# Patient Record
Sex: Male | Born: 1950 | Race: White | Hispanic: No | Marital: Single | Smoking: Former smoker
Health system: Southern US, Community
[De-identification: ages and names within clinical notes are randomized; demographics above are authoritative.]

## PROBLEM LIST (undated history)

## (undated) DIAGNOSIS — N200 Calculus of kidney: Secondary | ICD-10-CM

## (undated) HISTORY — DX: Calculus of kidney: N20.0

---

## 1992-06-15 HISTORY — PX: KIDNEY STONE SURGERY: SHX686

## 2002-09-21 ENCOUNTER — Ambulatory Visit (HOSPITAL_COMMUNITY): Admission: RE | Admit: 2002-09-21 | Discharge: 2002-09-21 | Payer: Self-pay | Admitting: Internal Medicine

## 2002-09-21 ENCOUNTER — Encounter: Payer: Self-pay | Admitting: Internal Medicine

## 2005-01-09 ENCOUNTER — Ambulatory Visit: Payer: Self-pay | Admitting: Internal Medicine

## 2005-01-12 ENCOUNTER — Ambulatory Visit: Payer: Self-pay | Admitting: *Deleted

## 2010-04-02 ENCOUNTER — Encounter (INDEPENDENT_AMBULATORY_CARE_PROVIDER_SITE_OTHER): Payer: Self-pay | Admitting: *Deleted

## 2010-05-20 ENCOUNTER — Ambulatory Visit: Payer: Self-pay | Admitting: Gastroenterology

## 2010-05-20 DIAGNOSIS — K219 Gastro-esophageal reflux disease without esophagitis: Secondary | ICD-10-CM | POA: Insufficient documentation

## 2010-06-23 ENCOUNTER — Telehealth: Payer: Self-pay | Admitting: Gastroenterology

## 2010-07-09 ENCOUNTER — Telehealth: Payer: Self-pay | Admitting: Gastroenterology

## 2010-07-09 ENCOUNTER — Ambulatory Visit: Admit: 2010-07-09 | Payer: Self-pay | Admitting: Gastroenterology

## 2010-07-17 NOTE — Letter (Signed)
Summary: New Patient letter  [facility]  [address] [place], [place] [address]   Phone: [phone]  Fax: [phone]       04/02/2010 MRN: [id number]  Wayne Mccarty [address] [place], [place]  [address]  Dear Wayne Mccarty,  Welcome to the Gastroenterology Division at [facility].    You are scheduled to see Dr. Arlyce Dice on 05/15/2010 at 8:30AM on the 3rd floor at [facility], [address]  We ask that you try to arrive at our office 15 minutes prior to your appointment time to allow for check-in.  We would like you to complete the enclosed self-administered evaluation form prior to your visit and bring it with you on the day of your appointment.  We will review it with you.  Also, please bring a complete list of all your medications or, if you prefer, bring the medication bottles and we will list them.  Please bring your insurance card so that we may make a copy of it.  If your insurance requires a referral to see a specialist, please bring your referral form from your primary care physician.  Co-payments are due at the time of your visit and may be paid by cash, check or credit card.     Your office visit will consist of a consult with your physician (includes a physical exam), any laboratory testing he/she may order, scheduling of any necessary diagnostic testing (e.g. x-ray, ultrasound, CT-scan), and scheduling of a procedure (e.g. Endoscopy, Colonoscopy) if required.  Please allow enough time on your schedule to allow for any/all of these possibilities.    If you cannot keep your appointment, please call [phone] to cancel or reschedule prior to your appointment date.  This allows [place] the opportunity to schedule an appointment for another patient in need of care.  If you do not cancel or reschedule by 5 p.m. the business day prior to your appointment date, you will be charged a $50.00 late cancellation/no-show fee.    Thank you for choosing  [facility] for your medical needs.  We appreciate the opportunity to care for you.  Please visit [place] at our website  to learn more about our practice.                     Sincerely,                                                             The Gastroenterology Division

## 2010-07-17 NOTE — Letter (Signed)
Summary: EGD Instructions  [facility]  [address] [place], [place] [address]   Phone: [phone]  Fax: [phone]       [name]    Jun 16, 1950    MRN: [id number]       Procedure Day /Date:TUESDAY 06/24/2010     Arrival Time: 2:30PM     Procedure Time:3:30PM     Location of Procedure:                    X  [facility] (4th Floor)    PREPARATION FOR ENDOSCOPY   On 06/24/2010  THE DAY OF THE PROCEDURE:  1.   No solid foods, milk or milk products are allowed after midnight the night before your procedure.  2.   Do not drink anything colored red or purple.  Avoid juices with pulp.  No orange juice.  3.  You may drink clear liquids until1:30PM , which is 2 hours before your procedure.                                                                                                CLEAR LIQUIDS INCLUDE: Water Jello Ice Popsicles Tea (sugar ok, no milk/cream) Powdered fruit flavored drinks Coffee (sugar ok, no milk/cream) Gatorade Juice: apple, white grape, white cranberry  Lemonade Clear bullion, consomm, broth Carbonated beverages (any kind) Strained chicken noodle soup Hard Candy   MEDICATION INSTRUCTIONS  Unless otherwise instructed, you should take regular prescription medications with a small sip of water as early as possible the morning of your procedure.            OTHER INSTRUCTIONS  You will need a responsible adult at least 62 years of age to accompany you and drive you home.   This person must remain in the waiting room during your procedure.  Wear loose fitting clothing that is easily removed.  Leave jewelry and other valuables at home.  However, you may wish to bring a book to read or an iPod/MP3 player to listen to music as you wait for your procedure to start.  Remove all body piercing jewelry and leave at home.  Total time from sign-in until discharge is approximately 2-3 hours.  You should go home  directly after your procedure and rest.  You can resume normal activities the day after your procedure.  The day of your procedure you should not:   Drive   Make legal decisions   Operate machinery   Drink alcohol   Return to work  You will receive specific instructions about eating, activities and medications before you leave.    The above instructions have been reviewed and explained to me by   _______________________    I fully understand and can verbalize these instructions _____________________________ Date _________

## 2010-07-17 NOTE — Letter (Signed)
Summary: Results Letter  [facility]  [address] [place], [place] [address]   Phone: [phone]  Fax: [phone]        May 20, 2010 MRN: [id number]    Wayne Mccarty [address] [place], [place]  [address]    Dear [name],  It is my pleasure to have treated you recently as a new patient in my office. I appreciate your confidence and the opportunity to participate in your care.  Since I do have a busy inpatient endoscopy schedule and office schedule, my office hours vary weekly. I am, however, available for emergency calls everyday through my office. If I am not available for an urgent office appointment, another one of our gastroenterologist will be able to assist you.  My well-trained staff are prepared to help you at all times. For emergencies after office hours, a physician from our Gastroenterology section is always available through my 24 hour answering service  Once again I welcome you as a new patient and I look forward to a happy and healthy relationship             Sincerely,  Louis Meckel MD  This letter has been electronically signed by your physician.  Appended Document: Results Letter LETTER MAILED

## 2010-07-17 NOTE — Assessment & Plan Note (Signed)
Summary: REFLUX, ESOPHAGITIS...AS.   History of Present Illness Visit Type: consult Primary GI MD: Melvia Heaps MD [facility] Primary Provider: Orpah Cobb, MD Requesting Provider: Orpah Cobb, MD Chief Complaint: pt states he is having reflux that will wake him up and he will have to take Pepcid AC.  Pt states ice cream or milkshakes will help his stomach feel better. History of Present Illness:   Wayne Mccarty is a 60 year old white male referred at the request of Dr. Algie Coffer for evaluation of reflux.  For at least 10 years she has been having severe burning chest discomfort, especially in the early a.m. upon awakening.  He has used various PPIs and H2 receptor antagonists.  The PPIs have caused a variety of side effects including headaches and diarrhea.  The H2 receptor antagonists have had variable success.  He denies dysphagia or cough.  Family history is pertinent for a sister who had colon cancer at age 22.   GI Review of Systems    Reports abdominal pain, acid reflux, bloating, and  heartburn.      Denies belching, chest pain, dysphagia with liquids, dysphagia with solids, loss of appetite, nausea, vomiting, vomiting blood, weight loss, and  weight gain.      Reports constipation, heme positive stool, and  hemorrhoids.     Denies anal fissure, black tarry stools, change in bowel habit, diarrhea, diverticulosis, fecal incontinence, irritable bowel syndrome, jaundice, light color stool, liver problems, rectal bleeding, and  rectal pain. Preventive Screening-Counseling & Management  Alcohol-Tobacco     Smoking Status: quit      Drug Use:  no.      Current Medications (verified): 1)  Pepcid Ac 10 Mg Tabs (Famotidine) .... 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  Alcohol Use - no Daily Caffeine Use 4/day Illicit Drug Use - no Smoking Status:  quit Drug Use:  no  Review of Systems       The patient complains of arthritis/joint pain, back pain, blood in urine, depression-new, fatigue, headaches-new, muscle pains/cramps, skin rash, sleeping problems, sore throat, thirst - excessive, and urination - excessive.  The patient denies allergy/sinus, anemia, anxiety-new, breast changes/lumps, confusion, cough, coughing up blood, fainting, fever, hearing problems, heart murmur, heart rhythm changes, itching, night sweats, nosebleeds, shortness of breath, swelling of feet/legs, swollen lymph glands, urination changes/pain, urine leakage, vision changes, and voice change.         All other systems were reviewed and were negative   Vital Signs:  Patient profile:   60 year old male Height:      72 inches Weight:      129 pounds BMI:     17.56 Pulse rate:   76 / minute Pulse rhythm:   regular BP sitting:   100 / 62  (left arm) Cuff size:   regular  Vitals Entered By: Francee Piccolo CMA Duncan Dull) (May 20, 2010 3:51 PM)  Physical Exam  Additional Exam:  On physical exam he is a thin male  skin: anicteric HEENT: normocephalic; PEERLA;  no nasal or pharyngeal abnormalities neck: supple nodes: no cervical lymphadenopathy chest: clear to ausculatation and percussion heart: no murmurs, gallops, or rubs abd: soft, nontender; BS normoactive; no abdominal masses, tenderness, organomegaly rectal: deferred ext: no cynanosis, clubbing, edema skeletal: no deformities neuro: oriented x 3; no focal abnormalities    Impression &  Recommendations:  Problem # 1:  GERD (ICD-530.81)  Patient remains symptomatic on his current regimen of Pepcid 20 mg daily.  Recommendations #1 increase Prevacid to b.i.d. with his last dose taking q.h.s.  I will try this prior to any in PPIs since he has been intolerant of PPIs due to side effects.   #2 endoscopy to rule out Barrett's esophagus  Risks, alternatives, and complications of the procedure, including bleeding, perforation, and possible need for surgery, were explained to the patient.  Patient's questions were answered.  Orders: EGD (EGD)  Problem # 2:  CARCINOMA, COLON, FAMILY HX (ICD-V16.0) Plan screening colonoscopy  Patient Instructions: 1)  Copy sent to : Orpah Cobb, MD 2)  We will see you at your procedure on 06/24/10. 3)  Please call our office after your EGD to schedule your colonoscopy. 4)  [facility] Patient Information Guide given to patient.  5)  Upper Endoscopy brochure given.  6)  The medication list was reviewed and reconciled.  All changed / newly prescribed medications were explained.  A complete medication list was provided to the patient / caregiver. Prescriptions: PEPCID AC MAXIMUM STRENGTH 20 MG CHEW (FAMOTIDINE) chew one by mouth two times a day  #60 x 3   Entered by:   Merri Ray CMA (AAMA)   Authorized by:   Louis Meckel MD   Signed by:   Louis Meckel MD on 05/21/2010   Method used:   Electronically to        [facility]  [address] (retail)       [address]       [place], [place]  [address]       Ph: [phone] or [phone]       Fax: [phone]   RxID:   [id number]

## 2010-07-23 NOTE — Progress Notes (Signed)
Summary: cx proc   Phone Note Call from Patient Call back at Home Phone [phone]   Caller: Patient Call For: Dr Arlyce Dice Summary of Call: Patient cancelled his EGD for tomorrow because his driver cancelled on him, patient rescheduled for next available 1-25.  Will you charge? Initial call taken by: Tawni Levy,  June 23, 2010 4:31 PM  Follow-up for Phone Call        Too late to fill the spot.  Yes, he should be charged Follow-up by: Louis Meckel MD,  June 24, 2010 10:09 AM  Additional Follow-up for Phone Call Additional follow up Details #1::        Patient BILLED Colon Cx fee $100. Additional Follow-up by: Leanor Kail [place],  July 15, 2010 4:30 PM

## 2010-07-23 NOTE — Progress Notes (Signed)
Summary: Canceled Endo  Phone Note Call from Patient Call back at Home Phone [phone]   Caller: Patient Call For: Dr. Arlyce Dice Reason for Call: Talk to Nurse Summary of Call: pt. canceled his EGD today because he could not find a driver. Would you like pt. charged the cancelation fee? Initial call taken by: Karna Christmas,  July 09, 2010 3:17 PM  Follow-up for Phone Call        yes Follow-up by: Louis Meckel MD,  July 09, 2010 3:20 PM  Additional Follow-up for Phone Call Additional follow up Details #1::        Patient BILLED $100 SAME DAY CX FEE. Additional Follow-up by: Leanor Kail [facility],  July 15, 2010 5:57 PM

## 2011-03-16 ENCOUNTER — Other Ambulatory Visit: Payer: Self-pay | Admitting: Gastroenterology

## 2011-05-20 ENCOUNTER — Telehealth: Payer: Self-pay | Admitting: Gastroenterology

## 2011-05-20 NOTE — Telephone Encounter (Signed)
Pt states that the Famotadine is not working as well as it was, pt states that he tried Nexium and it did not work for him either. Pt would like something else to take for GERD. Please advise.

## 2011-05-21 MED ORDER — OMEPRAZOLE-SODIUM BICARBONATE 40-1100 MG PO CAPS: 1.0000 | ORAL_CAPSULE | Freq: Every day | ORAL | Status: AC

## 2011-05-21 NOTE — Telephone Encounter (Signed)
Pt states that his PCP gave him samples of Dexilant and it "didn't work to good." Pt would like to try Zegerid, states some friends of his have tried it and it worked well for them. Let pt know he needed the egd and colon. Pt states he will call back to schedule the procedures. Please advise.

## 2011-05-21 NOTE — Telephone Encounter (Signed)
At his last office visit endoscopy was recommended. If this was not done he should have it done.  Let's try dexilant 60 mg once a day  10 she had a colonoscopy in the last 5 years, if not he needs one because of a family history

## 2011-05-21 NOTE — Telephone Encounter (Signed)
Okay for  Zegerid

## 2011-05-21 NOTE — Telephone Encounter (Signed)
Rx sent to pharmacy   

## 2011-05-26 ENCOUNTER — Telehealth: Payer: Self-pay | Admitting: *Deleted

## 2011-05-26 NOTE — Telephone Encounter (Signed)
L/M for patient that insurance will not cover Zegerid unless he has been on Protonix,Prevacid OTC or Omeprazole.Marland KitchenMarland KitchenMarland Kitchen

## 2011-05-27 ENCOUNTER — Telehealth: Payer: Self-pay | Admitting: *Deleted

## 2011-05-27 NOTE — Telephone Encounter (Signed)
Tried to contact pt to inform him his medication was approved for Zegerid. No Answer No Answering Machine

## 2011-05-28 NOTE — Telephone Encounter (Signed)
Approved for 1 year.

## 2011-07-22 ENCOUNTER — Telehealth: Payer: Self-pay | Admitting: Gastroenterology

## 2011-07-23 NOTE — Telephone Encounter (Signed)
Left message for pt to call back  °

## 2011-07-27 NOTE — Telephone Encounter (Signed)
Left message for pt to call back  °

## 2011-07-30 NOTE — Telephone Encounter (Signed)
Pt wanted to let us know he had a CT scan done by urology and thought it might be something we might want to look at. Thanked pt and let him know to call us back if we could help with anything further.

## 2012-03-07 ENCOUNTER — Other Ambulatory Visit: Payer: Self-pay | Admitting: Gastroenterology

## 2012-03-07 DIAGNOSIS — R1013 Epigastric pain: Secondary | ICD-10-CM

## 2012-03-15 ENCOUNTER — Other Ambulatory Visit: Payer: Self-pay

## 2012-03-31 ENCOUNTER — Other Ambulatory Visit: Payer: Self-pay

## 2017-07-31 ENCOUNTER — Other Ambulatory Visit: Payer: Self-pay

## 2017-07-31 ENCOUNTER — Emergency Department
Admission: EM | Admit: 2017-07-31 | Discharge: 2017-08-01 | Disposition: A | Discharge status: Home / Self Care | Payer: Medicare Other | Attending: Emergency Medicine | Admitting: Emergency Medicine

## 2017-07-31 ENCOUNTER — Encounter: Payer: Self-pay | Admitting: Emergency Medicine

## 2017-07-31 DIAGNOSIS — N200 Calculus of kidney: Secondary | ICD-10-CM | POA: Insufficient documentation

## 2017-07-31 DIAGNOSIS — R319 Hematuria, unspecified: Secondary | ICD-10-CM | POA: Diagnosis not present

## 2017-07-31 DIAGNOSIS — Z87891 Personal history of nicotine dependence: Secondary | ICD-10-CM | POA: Insufficient documentation

## 2017-07-31 DIAGNOSIS — Z79899 Other long term (current) drug therapy: Secondary | ICD-10-CM | POA: Diagnosis not present

## 2017-07-31 LAB — CBC
HEMATOCRIT: 39.4 % — AB (ref 40.0–52.0)
Hemoglobin: 13.7 g/dL (ref 13.0–18.0)
MCH: 30 pg (ref 26.0–34.0)
MCHC: 34.7 g/dL (ref 32.0–36.0)
MCV: 86.4 fL (ref 80.0–100.0)
PLATELETS: 172 10*3/uL (ref 150–440)
RBC: 4.56 MIL/uL (ref 4.40–5.90)
RDW: 14.1 % (ref 11.5–14.5)
WBC: 3.6 10*3/uL — ABNORMAL LOW (ref 3.8–10.6)

## 2017-07-31 LAB — URINALYSIS, COMPLETE (UACMP) WITH MICROSCOPIC
Bacteria, UA: NONE SEEN
Bilirubin Urine: NEGATIVE
GLUCOSE, UA: NEGATIVE mg/dL
Ketones, ur: NEGATIVE mg/dL
NITRITE: NEGATIVE
PH: 5 (ref 5.0–8.0)
Protein, ur: 100 mg/dL — AB
SPECIFIC GRAVITY, URINE: 1.019 (ref 1.005–1.030)

## 2017-07-31 LAB — BASIC METABOLIC PANEL
Anion gap: 7 (ref 5–15)
BUN: 24 mg/dL — AB (ref 6–20)
CALCIUM: 8.5 mg/dL — AB (ref 8.9–10.3)
CO2: 29 mmol/L (ref 22–32)
Chloride: 103 mmol/L (ref 101–111)
Creatinine, Ser: 0.94 mg/dL (ref 0.61–1.24)
GFR calc Af Amer: >60 mL/min (ref >60)
GLUCOSE: 85 mg/dL (ref 65–99)
Potassium: 3.7 mmol/L (ref 3.5–5.1)
Sodium: 139 mmol/L (ref 135–145)

## 2017-07-31 NOTE — ED Triage Notes (Signed)
Pt ambulatory to triage in NAD, reports blood in urine, reports known kidney stone, reports intermittent pain.

## 2017-08-01 ENCOUNTER — Emergency Department: Payer: Medicare Other

## 2017-08-01 DIAGNOSIS — R319 Hematuria, unspecified: Secondary | ICD-10-CM | POA: Diagnosis not present

## 2017-08-01 MED ORDER — ONDANSETRON 4 MG PO TBDP: 4.0000 mg | ORAL_TABLET | Freq: Three times a day (TID) | ORAL | 0 refills | Status: DC | PRN

## 2017-08-01 MED ORDER — TRAMADOL HCL 50 MG PO TABS: 50.0000 mg | ORAL_TABLET | Freq: Four times a day (QID) | ORAL | 0 refills | Status: DC | PRN

## 2017-08-01 NOTE — ED Notes (Signed)
Pt reports blood in his urine today. Reports hx of kidney stone years ago. Reports intermittent discomfort to his right lower flank region but no pain at this time.

## 2017-08-01 NOTE — ED Provider Notes (Signed)
[facility] Emergency Department Provider Note   ____________________________________________   First MD Initiated Contact with Patient 07/31/17 2359     (approximate)  I have reviewed the triage vital signs and the nursing notes.   HISTORY  Chief Complaint Hematuria    HPI Wayne Mccarty is a 67 y.o. male who comes into the hospital today stating that he has been passing some blood in his urine.  The patient states that it started a couple of weeks ago.  He reports that he has had some pains in his back and he could not sleep.  He took some oxycodone's to help him sleep when he had this pain.  He reports then that he became sick with a respiratory illness.  He had some joint pains with some chills and scratchy throat.  He reports that he is unsure if it is due to his kidneys or not.  He has a history of a stone he reports is 30 mm in his kidney from a couple of years ago.  He states that he has not checked it in a while so he is concerned that it is much larger.  He has some occasional pains on his right side but came in because he was concerned about the blood in his urine.  The patient also has some pressure in his bladder when he coughs.  He is here today for evaluation.  History reviewed. No pertinent past medical history.  Patient Active Problem List   Diagnosis Date Noted  . GERD 05/20/2010    History reviewed. No pertinent surgical history.  Prior to Admission medications   Medication Sig Start Date End Date Taking? Authorizing Provider  famotidine (PEPCID) 20 MG tablet TAKE 1 TABLET BY MOUTH 2 TIMES A DAY 03/16/11   Inda Castle, MD  ondansetron (ZOFRAN ODT) 4 MG disintegrating tablet Take 1 tablet (4 mg total) by mouth every 8 (eight) hours as needed for nausea or vomiting. 08/01/17   Loney Hering, MD  traMADol (ULTRAM) 50 MG tablet Take 1 tablet (50 mg total) by mouth every 6 (six) hours as needed. 08/01/17   Loney Hering, MD     Allergies Sulfa antibiotics  History reviewed. No pertinent family history.  Social History Social History   Tobacco Use  . Smoking status: Former Research scientist (life sciences)  . Smokeless tobacco: Never Used  Substance Use Topics  . Alcohol use: Yes  . Drug use: Not on file    Review of Systems  Constitutional: No fever/chills Eyes: No visual changes. ENT: No sore throat. Cardiovascular: Denies chest pain. Respiratory: Denies shortness of breath. Gastrointestinal: No abdominal pain.  No nausea, no vomiting.  No diarrhea.  No constipation. Genitourinary: hematuria Musculoskeletal:  back pain. Skin: Negative for rash. Neurological: Negative for headaches, focal weakness or numbness.   ____________________________________________   PHYSICAL EXAM:  VITAL SIGNS: ED Triage Vitals  Enc Vitals Group     BP 07/31/17 2041 116/70     Pulse Rate 07/31/17 2041 76     Resp 07/31/17 2041 16     Temp 07/31/17 2041 97.7 F (36.5 C)     Temp Source 07/31/17 2041 Oral     SpO2 07/31/17 2041 100 %     Weight 07/31/17 2042 150 lb (68 kg)     Height 07/31/17 2042 5\' 11"  (1.803 m)     Head Circumference --      Peak Flow --      Pain Score --  Pain Loc --      Pain Edu? --      Excl. in [place]? --     Constitutional: Alert and oriented. Well appearing and in no acute distress. Eyes: Conjunctivae are normal. PERRL. EOMI. Head: Atraumatic. Nose: No congestion/rhinnorhea. Mouth/Throat: Mucous membranes are moist.  Oropharynx non-erythematous. Cardiovascular: Normal rate, regular rhythm. Grossly normal heart sounds.  Good peripheral circulation. Respiratory: Normal respiratory effort.  No retractions. Lungs CTAB. Gastrointestinal: Soft with mild RLQ abd pain. No distention. Positive bowel sounds Musculoskeletal: No lower extremity tenderness nor edema.  Neurologic:  Normal speech and language.  Skin:  Skin is warm, dry and intact.  Psychiatric: Mood and affect are normal.    ____________________________________________   LABS (all labs ordered are listed, but only abnormal results are displayed)  Labs Reviewed  URINALYSIS, COMPLETE (UACMP) WITH MICROSCOPIC - Abnormal; Notable for the following components:      Result Value   Color, Urine AMBER (*)    APPearance CLOUDY (*)    Hgb urine dipstick LARGE (*)    Protein, ur 100 (*)    Leukocytes, UA MODERATE (*)    Squamous Epithelial / LPF 0-5 (*)    All other components within normal limits  CBC - Abnormal; Notable for the following components:   WBC 3.6 (*)    HCT 39.4 (*)    All other components within normal limits  BASIC METABOLIC PANEL - Abnormal; Notable for the following components:   BUN 24 (*)    Calcium 8.5 (*)    All other components within normal limits   ____________________________________________  EKG  none ____________________________________________  RADIOLOGY  ED MD interpretation:  CT renal stone study: large right staghorn calculus  Official radiology report(s): Ct Renal Stone Study  Result Date: 08/01/2017 CLINICAL DATA:  Hematuria and right lower quadrant pain EXAM: CT ABDOMEN AND PELVIS WITHOUT CONTRAST TECHNIQUE: Multidetector CT imaging of the abdomen and pelvis was performed following the standard protocol without IV contrast. COMPARISON:  CT abdomen pelvis 05/02/2010 FINDINGS: Lower chest: No basilar pulmonary nodules or pleural effusion. No apical pericardial effusion. Hepatobiliary: Normal hepatic contours and density. No visible biliary dilatation. Normal gallbladder. Pancreas: Normal parenchymal contours without ductal dilatation. No peripancreatic fluid collection. Spleen: Normal. Adrenals/Urinary Tract: --Adrenal glands: Normal. 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 There are multiple other large right renal stones. No right ureteral obstruction.  --Left kidney/ureter: No hydronephrosis, perinephric stranding or nephrolithiasis. No obstructing ureteral stones. --Urinary bladder: Bladder wall is thickened, but unchanged. Stomach/Bowel: --Stomach/Duodenum: No hiatal hernia or other gastric abnormality. Normal duodenal course. --Small bowel: No dilatation or inflammation. --Colon: No focal abnormality. --Appendix: Normal. Vascular/Lymphatic: Atherosclerotic calcification is present within the non-aneurysmal abdominal aorta, without hemodynamically significant stenosis. No abdominal or pelvic lymphadenopathy. Reproductive: Prostate calcification. Musculoskeletal. No bony spinal canal stenosis or focal osseous abnormality. Other: Small right inguinal hernia containing a short segment of bowel. IMPRESSION: 1. 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 Multiple other nonobstructive right renal calculi. 2. No left nephrolithiasis. 3. No obstructing ureteral stones. 4. Unchanged urinary bladder wall thickening. Electronically Signed   By: Ulyses Jarred M.D.   On: 08/01/2017 01:06    ____________________________________________   PROCEDURES  Procedure(s) performed: None  Procedures  Critical Care performed: No  ____________________________________________   INITIAL IMPRESSION / ASSESSMENT AND PLAN / ED COURSE  As part of my medical decision making, I reviewed the following data within the electronic MEDICAL RECORD NUMBER Notes from prior ED visits and [place] Controlled Substance Database   This is a 67 year old who comes into the hospital today with some hematuria.  My differential diagnosis includes kidney stone, urinary tract infection, bladder mass.  I did check some blood work on the patient which was unremarkable.  His urine showed too numerous to count red blood cells and white blood cells but he does not have any bacteria in his urine.  He also has no nitrates.  I sent the patient for a CT  renal stone study and he was found to have a large right staghorn calculus that has increased in size from November 2011. The patient though has some chronic unchanged bladder wall thickening which is likely from the same time.  The patient is not having any significant pain at this time.  I will referred the patient to urology to follow-up to have his calculus removed given his hematuria.  The patient will be discharged to follow-up.      ____________________________________________   FINAL CLINICAL IMPRESSION(S) / ED DIAGNOSES  Final diagnoses:  Hematuria, unspecified type  Staghorn calculus     ED Discharge Orders        Ordered    traMADol (ULTRAM) 50 MG tablet  Every 6 hours PRN     08/01/17 0159    ondansetron (ZOFRAN ODT) 4 MG disintegrating tablet  Every 8 hours PRN     08/01/17 0159       Note:  This document was prepared using Dragon voice recognition software and may include unintentional dictation errors.    Loney Hering, MD 08/01/17 0200

## 2017-08-01 NOTE — Discharge Instructions (Signed)
Please follow up with urology to have this kidney stone removed. Please return with any worsened condition

## 2017-10-15 ENCOUNTER — Encounter: Payer: Self-pay | Admitting: Urology

## 2017-10-15 ENCOUNTER — Ambulatory Visit (INDEPENDENT_AMBULATORY_CARE_PROVIDER_SITE_OTHER): Payer: Medicare Other | Admitting: Urology

## 2017-10-15 VITALS — BP 90/58 | HR 79 | Ht 66.0 in | Wt 125.6 lb

## 2017-10-15 DIAGNOSIS — N200 Calculus of kidney: Secondary | ICD-10-CM | POA: Diagnosis not present

## 2017-10-15 LAB — URINALYSIS, COMPLETE
Bilirubin, UA: NEGATIVE
Glucose, UA: NEGATIVE
Ketones, UA: NEGATIVE
Nitrite, UA: NEGATIVE
Specific Gravity, UA: >1.03 — ABNORMAL HIGH (ref 1.005–1.030)
Urobilinogen, Ur: 0.2 mg/dL (ref 0.2–1.0)
pH, UA: 5 (ref 5.0–7.5)

## 2017-10-15 LAB — MICROSCOPIC EXAMINATION: Epithelial Cells (non renal): NONE SEEN /hpf (ref 0–10)

## 2017-10-15 NOTE — Progress Notes (Signed)
10/15/2017 2:44 PM   Wayne Mccarty 1951-01-16 [id number]  Referring provider: Dixie Dials, MD [address] [place], [place] [address]  Chief Complaint  Patient presents with  . New Patient (Initial Visit)  . 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  It has increased in size since 2011.  The patient at this point endorses right lower back pain.  He also complains of an electrical shock feeling in his right lower quadrant.  He has no right flank pain.  He he has seen gross hematuria on a few occasions, but this is not very common.  Patient does have a history of a PCNL of his right kidney back in 1994.  He believes his stones are calcium oxalate.  He noted that this took multiple procedures but outside of this previous stone, he has had no other episodes of nephrolithiasis.   PMH: Past Medical History:  Diagnosis Date  . Kidney stones     Surgical History: Past Surgical History:  Procedure Laterality Date  . KIDNEY STONE SURGERY  1994    Home Medications:  Allergies as of 10/15/2017      Reactions   Sulfa Antibiotics       Medication List        Accurate as of 10/15/17  2:44 PM. Always use your most recent med list.          famotidine 20 MG tablet Commonly known as:  PEPCID TAKE 1 TABLET BY MOUTH 2 TIMES A DAY   ondansetron 4 MG disintegrating tablet Commonly known as:  ZOFRAN ODT Take 1 tablet (4 mg total) by mouth every 8 (eight) hours as needed for nausea or vomiting.   traMADol 50 MG tablet Commonly known as:  ULTRAM Take 1 tablet (50 mg total) by mouth every 6 (six) hours as needed.       Allergies:  Allergies  Allergen Reactions  . Sulfa Antibiotics     Family History: History reviewed. No pertinent family  history.  Social History:  reports that he has quit smoking. He has never used smokeless tobacco. He reports that he drinks about 4.2 oz of alcohol per week. He reports that he has current or past drug history.  ROS: UROLOGY Frequent Urination?: Yes Hard to postpone urination?: Yes Burning/pain with urination?: No Get up at night to urinate?: Yes Leakage of urine?: Yes Urine stream starts and stops?: No Trouble starting stream?: No Do you have to strain to urinate?: No Blood in urine?: Yes Urinary tract infection?: No Sexually transmitted disease?: No Injury to kidneys or bladder?: No Painful intercourse?: No Weak stream?: Yes Erection problems?: No Penile pain?: No  Gastrointestinal Nausea?: No Vomiting?: No Indigestion/heartburn?: No Diarrhea?: Yes Constipation?: Yes  Constitutional Fever: No Night sweats?: No Weight loss?: No Fatigue?: Yes  Skin Skin rash/lesions?: Yes Itching?: No  Eyes Blurred vision?: Yes Double vision?: No  Ears/Nose/Throat Sore throat?: Yes Sinus problems?: No  Hematologic/Lymphatic Swollen glands?: No Easy bruising?: No  Cardiovascular Leg swelling?: No Chest pain?: No  Respiratory Cough?: Yes Shortness of breath?: Yes  Endocrine Excessive thirst?: Yes  Musculoskeletal Back pain?: Yes Joint pain?: Yes  Neurological Headaches?: No Dizziness?: Yes  Psychologic Depression?: No Anxiety?: No  Physical Exam: BP (!) 90/58 (BP Location: Right Arm, Patient Position: Sitting,  Cuff Size: Normal)   Pulse 79   Ht 5\' 6"  (1.676 m)   Wt 125 lb 9.6 oz (57 kg)   SpO2 97%   BMI 20.27 kg/m   Constitutional:  Alert and oriented, No acute distress. HEENT: [place] AT, moist mucus membranes.  Trachea midline, no masses. Cardiovascular: No clubbing, cyanosis, or edema. Respiratory: Normal respiratory effort, no increased work of breathing. GI: Abdomen is soft, nontender, nondistended, no abdominal masses GU: No CVA tenderness.   Skin: No rashes, bruises or suspicious lesions. Lymph: No cervical or inguinal adenopathy. Neurologic: Grossly intact, no focal deficits, moving all 4 extremities. Psychiatric: Normal mood and affect.  Laboratory Data: Lab Results  Component Value Date   WBC 3.6 (L) 07/31/2017   HGB 13.7 07/31/2017   HCT 39.4 (L) 07/31/2017   MCV 86.4 07/31/2017   PLT 172 07/31/2017    Lab Results  Component Value Date   CREATININE 0.94 07/31/2017    No results found for: PSA  No results found for: TESTOSTERONE  No results found for: HGBA1C  Urinalysis    Component Value Date/Time   COLORURINE AMBER (A) 07/31/2017 2040   APPEARANCEUR CLOUDY (A) 07/31/2017 2040   LABSPEC 1.019 07/31/2017 2040   PHURINE 5.0 07/31/2017 2040   GLUCOSEU NEGATIVE 07/31/2017 2040   HGBUR LARGE (A) 07/31/2017 2040   [place] NEGATIVE 07/31/2017 2040   [place] 07/31/2017 2040   PROTEINUR 100 (A) 07/31/2017 2040   NITRITE NEGATIVE 07/31/2017 2040   LEUKOCYTESUR MODERATE (A) 07/31/2017 2040    Pertinent Imaging: CT stone protocol report and images reviewed as above.  Assessment & Plan:    1.  Partial right staghorn calculus I had a very long conversation with the patient regarding his large partial staghorn calculus that has grown since an imaging study in 2011.  We did discuss that it is strongly recommended to address a stone of this size.  He was made aware of the risk of continued growth with possible loss of his right kidney if it is left untreated.  We discussed that the only viable way is to undergo a PCNL.  He is familiar with this procedure as he had it back in 1994.  We discussed the risk, benefits, and indications of the procedure in great detail.  He understands the risks include but not limited to infection, bleeding, hematoma, multiple procedures, need for multiple forms of drainage of his right kidney, and loss of right kidney.  We discussed that initial access is obtained by  interventional radiology via right nephroureteral stent.  We discussed the need for nephrostomy tube, right ureteral stent, and Foley postoperatively.  He would likely go home with just the right ureteral stent though.  He understands that he will be admitted to the hospital for at least one night.  He does understand that this will likely take more than one procedure.  All questions were answered.  At this point, the patient would like to think about it before scheduling surgery.  I will have him follow-up in 4 weeks with my partner, Dr. Erlene Quan, who would be the one performing this operation.   Return in about 1 month (around 11/12/2017) for w/ Dr. Erlene Quan to discuss PCNL.  Nickie Retort, MD  [facility] [address], [place], [place] [address] [phone]

## 2017-11-09 ENCOUNTER — Ambulatory Visit: Payer: Medicare Other | Admitting: Urology

## 2017-11-09 ENCOUNTER — Telehealth: Payer: Self-pay | Admitting: Urology

## 2017-11-09 NOTE — Telephone Encounter (Signed)
FYI  --- Pt cx'd appt today to discuss PCNL due to not having AC in his car, pt states too hot to drive to appt.  Explained to pt that July would probably be hot as well, pt insisted to reschedule appt to July.

## 2018-01-04 ENCOUNTER — Encounter: Payer: Self-pay | Admitting: Urology

## 2018-01-04 ENCOUNTER — Ambulatory Visit: Payer: Medicare Other | Admitting: Urology

## 2018-09-15 ENCOUNTER — Ambulatory Visit: Payer: Medicare Other | Admitting: Adult Health

## 2018-09-15 NOTE — Progress Notes (Deleted)
   Subjective:    Patient ID: Wayne Mccarty, male    DOB: [birth year], 68 y.o.   MRN: [id number]  HPI:  Wayne Mccarty is here to establish as a new pt.  He is a pleasant 68 year old male.   PMH:   Patient Care Team    Relationship Specialty Notifications Start End  Dixie Dials, MD PCP - General Cardiology  07/31/17     Patient Active Problem List   Diagnosis Date Noted  . GERD 05/20/2010     Past Medical History:  Diagnosis Date  . Kidney stones      Past Surgical History:  Procedure Laterality Date  . KIDNEY STONE SURGERY  1994     No family history on file.   Social History   Substance and Sexual Activity  Drug Use Not Currently     Social History   Substance and Sexual Activity  Alcohol Use Yes  . Alcohol/week: 7.0 standard drinks  . Types: 7 Cans of beer per week     Social History   Tobacco Use  Smoking Status Former Smoker  Smokeless Tobacco Never Used     Outpatient Encounter Medications as of 09/15/2018  Medication Sig  . famotidine (PEPCID) 20 MG tablet TAKE 1 TABLET BY MOUTH 2 TIMES A DAY (Patient not taking: Reported on 10/15/2017)  . ondansetron (ZOFRAN ODT) 4 MG disintegrating tablet Take 1 tablet (4 mg total) by mouth every 8 (eight) hours as needed for nausea or vomiting. (Patient not taking: Reported on 10/15/2017)  . traMADol (ULTRAM) 50 MG tablet Take 1 tablet (50 mg total) by mouth every 6 (six) hours as needed. (Patient not taking: Reported on 10/15/2017)   No facility-administered encounter medications on file as of 09/15/2018.     Allergies: Sulfa antibiotics  There is no height or weight on file to calculate BMI.  There were no vitals taken for this visit.     Review of Systems     Objective:   Physical Exam        Assessment & Plan:  No diagnosis found.  No problem-specific Assessment & Plan notes found for this encounter.    FOLLOW-UP:  No follow-ups on file.

## 2018-11-10 ENCOUNTER — Other Ambulatory Visit: Payer: Self-pay

## 2018-11-10 ENCOUNTER — Ambulatory Visit (INDEPENDENT_AMBULATORY_CARE_PROVIDER_SITE_OTHER): Payer: Medicare Other | Admitting: Adult Health

## 2018-11-10 ENCOUNTER — Encounter: Payer: Self-pay | Admitting: Adult Health

## 2018-11-10 DIAGNOSIS — Z1159 Encounter for screening for other viral diseases: Secondary | ICD-10-CM | POA: Diagnosis not present

## 2018-11-10 DIAGNOSIS — R5383 Other fatigue: Secondary | ICD-10-CM | POA: Diagnosis not present

## 2018-11-10 DIAGNOSIS — Z114 Encounter for screening for human immunodeficiency virus [HIV]: Secondary | ICD-10-CM | POA: Diagnosis not present

## 2018-11-10 DIAGNOSIS — Z0000 Encounter for general adult medical examination without abnormal findings: Secondary | ICD-10-CM

## 2018-11-10 DIAGNOSIS — N200 Calculus of kidney: Secondary | ICD-10-CM | POA: Insufficient documentation

## 2018-11-10 DIAGNOSIS — K219 Gastro-esophageal reflux disease without esophagitis: Secondary | ICD-10-CM

## 2018-11-10 NOTE — Progress Notes (Signed)
Virtual Visit via Telephone Note  I connected with Wayne Mccarty on 11/10/18 at  2:30 PM EDT by telephone and verified that I am speaking with the correct person using two identifiers.  Location: Patient: Home Provider: In Clinic   I discussed the limitations, risks, security and privacy concerns of performing an evaluation and management service by telephone and the availability of in person appointments. I also discussed with the patient that there may be a patient responsible charge related to this service. The patient expressed understanding and agreed to proceed.  History of Present Illness: Wayne Mccarty calls in today to establish as a new pt.  He is a pleasant 68 year old male. 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 Caryl Pina Brandon/Urology due to inclimate weather, specifically it was too hot to drive in May back to her office. He reports intermittent flank pain that he believes is r/t to to untreated nephrolithiasis, advised him to follow-up with his established Urologist. When reviewing 2011 GI notes, it was mentioned that his sister had colon ca at age 20. He has never had recommended colonoscopy. Discussed at length the importance of having C-scope with current sx's (Diarrhea and mucus in stool) with first degree family hx of colon ca, he states "I don't have anyone to drive me home" He reports intermittent L arm weakness and pain since Oct 2019 that he believes is a result of his flu vaccination he received in Sept 2019. However he reports some sort of fall injury to LUE Oct 2019- he has never had any medical evaluation of LUE He is I L hand dominant He reports [place] area and will remove what he believes is flat deer ticks from his skin. He denies ever seeing a bull eyes rash He reports ever  increasing diffuse myalgia's. He reports fatigue but has a haphazard sleeping pattern: Frequent napping and will often stay up until 0500 watching television then will fall asleep late mid-morning and sleep all day. He reports a diet high in soda  (Dr.Pepper, Cheerwine, Pepsi) and rich in CHO. He reports walking up/down his driveway 10 times which is "about 1 mile" several times per week. He is retired Therapist, nutritional He reports few local friends and no local family  Of Note- pt is poor historian  Patient Care Team    Relationship Specialty Notifications Start End  Dixie Dials, MD PCP - General Cardiology  07/31/17     Patient Active Problem List   Diagnosis Date Noted  . GERD 05/20/2010     Past Medical History:  Diagnosis Date  . Kidney stones      Past Surgical History:  Procedure Laterality Date  . KIDNEY STONE SURGERY  1994     No family history on file.   Social History   Substance and Sexual Activity  Drug Use Not Currently     Social History   Substance and Sexual Activity  Alcohol Use Yes  . Alcohol/week: 7.0 standard drinks  . Types: 7 Cans of beer per week     Social History   Tobacco Use  Smoking Status Former Smoker  Smokeless Tobacco Never Used     Outpatient Encounter Medications as of 11/10/2018  Medication Sig  . famotidine (PEPCID) 20 MG tablet TAKE 1 TABLET BY MOUTH 2 TIMES A DAY (Patient not taking: Reported  on 10/15/2017)  . ondansetron (ZOFRAN ODT) 4 MG disintegrating tablet Take 1 tablet (4 mg total) by mouth every 8 (eight) hours as needed for nausea or vomiting. (Patient not taking: Reported on 10/15/2017)  . traMADol (ULTRAM) 50 MG tablet Take 1 tablet (50 mg total) by mouth every 6 (six) hours as needed. (Patient not taking: Reported on 10/15/2017)   No facility-administered encounter medications on file as of 11/10/2018.     Allergies: Sulfa antibiotics  There is no height or weight on file to calculate BMI.  There were no vitals  taken for this visit. Review of Systems: General:   Denies fever, chills, unexplained weight loss.  Optho/Auditory:   Denies visual changes, blurred vision/LOV Respiratory:   Denies SOB, DOE more than baseline levels.  Cardiovascular:   Denies chest pain, palpitations, new onset peripheral edema  Gastrointestinal:   Denies nausea, vomiting, diarrhea.  Genitourinary: Denies dysuria, freq/ urgency, flank pain +, discharge from genitals.  Endocrine:     Denies hot or cold intolerance, polyuria, polydipsia. Musculoskeletal:   Myalgias +, joint swelling, unexplained LUE Pain +, gait problems.  Skin:  Denies rash, suspicious lesions Neurological:     Denies dizziness, unexplained weakness, numbness  Psychiatric/Behavioral:   Denies mood changes, suicidal or homicidal ideations, hallucinations This patient does not have sx concerning for COVID-19 Infection (ie; fever, chills, cough, new or worsening shortness of breath). Observations/Objective: No acute distress noted during telephone conversation, however pt is a poor historian  Assessment and Plan: Increase water intake, follow Mediterranean diet Increase daily walking Continue to abstain from tobacco Follow-up with Urologist Strongly recommend Colonoscopy  Follow Up Instructions: 6 weeks fasting labs, then following week CPE   I discussed the assessment and treatment plan with the patient. The patient was provided an opportunity to ask questions and all were answered. The patient agreed with the plan and demonstrated an understanding of the instructions.   The patient was advised to call back or seek an in-person evaluation if the symptoms worsen or if the condition fails to improve as anticipated.  I provided 45 minutes of non-face-to-face time during this encounter.   Esaw Grandchild, NP

## 2018-11-10 NOTE — Assessment & Plan Note (Signed)
F/u with established Urologist Dr. Hollice Espy

## 2018-11-10 NOTE — Assessment & Plan Note (Signed)
Assessment and Plan: Increase water intake, follow Mediterranean diet Increase daily walking Continue to abstain from tobacco Follow-up with Urologist Strongly recommend Colonoscopy  Follow Up Instructions: 6 weeks fasting labs, then following week CPE   I discussed the assessment and treatment plan with the patient. The patient was provided an opportunity to ask questions and all were answered. The patient agreed with the plan and demonstrated an understanding of the instructions.   The patient was advised to call back or seek an in-person evaluation if the symptoms worsen or if the condition fails to improve as anticipated.

## 2018-12-14 ENCOUNTER — Other Ambulatory Visit: Payer: Medicare Other

## 2018-12-22 ENCOUNTER — Encounter: Payer: Medicare Other | Admitting: Adult Health

## 2018-12-22 NOTE — Progress Notes (Deleted)
   Subjective:    Patient ID: Wayne Mccarty, male    DOB: 08-15-1950, 68 y.o.   MRN: [id number]  HPI:  Wayne Mccarty is here for CPE  Needs fasting labs  Healthcare Maintenance: Colonoscopy- LDCT- AAA Screening- Immunizations-  Patient Care Team    Relationship Specialty Notifications Start End  Esaw Grandchild, NP PCP - General Family Medicine  11/10/18     Patient Active Problem List   Diagnosis Date Noted  . Fatigue 11/10/2018  . Healthcare maintenance 11/10/2018  . 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 GERD 05/20/2010     Past Medical History:  Diagnosis Date  . Kidney stones      Past Surgical History:  Procedure Laterality Date  . KIDNEY STONE SURGERY  1994     Family History  Problem Relation Age of Onset  . Heart attack Father   . Cancer Mother        pancreatic  . Cancer Sister        colon     Social History   Substance and Sexual Activity  Drug Use Not Currently     Social History   Substance and Sexual Activity  Alcohol Use Not Currently  . Alcohol/week: 7.0 standard drinks  . Types: 7 Cans of beer per week     Social History   Tobacco Use  Smoking Status Former Smoker  . Packs/day: 0.25  . Years: 45.00  . Pack years: 11.25  . Types: Cigarettes  . Quit date: 03/15/2018  . Years since quitting: 0.7  Smokeless Tobacco Never Used     No outpatient encounter medications on file as of 12/22/2018.   No facility-administered encounter medications on file as of 12/22/2018.     Allergies: Sulfa antibiotics  There is no height or weight on file to calculate BMI.  There were no vitals taken for this visit.     Review of Systems     Objective:   Physical Exam        Assessment & Plan:  No diagnosis found.  No problem-specific Assessment & Plan notes found for this encounter.    FOLLOW-UP:  No follow-ups on file.

## 2019-09-13 ENCOUNTER — Telehealth: Payer: Self-pay | Admitting: Adult Health

## 2019-09-13 NOTE — Telephone Encounter (Signed)
Noted.  T. Natalija Mavis, CMA 

## 2019-09-13 NOTE — Telephone Encounter (Signed)
Pt called seek appt w/ provider yesterday at closing, states his stomach has been a little achy & he has noticed a sweet taste in his mouth-- Advised him unfortunately no available appts & not to wait for appt but to seek medical treatment sooner rather than to wait til late April or wait for someone cancels their appt--- Also advised him to go to nearest UC or ED for immediate medical attention due to the office closing & lack of available appts.  --FYI to med staff.  --Dion Body

## 2019-10-16 NOTE — Progress Notes (Signed)
Established Patient Office Visit  Subjective:  Patient ID: Wayne Mccarty, male    DOB: Sep 02, 1950  Age: 69 y.o. MRN: [id number]  CC:  Chief Complaint  Patient presents with  . Abdominal Pain  . Hand Pain    HPI Wayne Mccarty presents for complaints of abdominal pain and frequent bowel movements with loose stools for 2 months. Pain is mostly in her lower abdomen. Pt goes to the bathroom about 5-6 times/day and states stool is like "brown tomato soup."  Soft foods like chicken noodle soup make his pain better. He has tried OTC imodium and pepto-bismol with minimal relief. Also reports intermittent stomach cramps and flatulence. Denies nausea, vomiting, hematochezia, melena, or constipation. Reports family history of pancreatic cancer. He also states he removed a tick bite a couple days ago and one night had chills and fever of 100.9.  Past Medical History:  Diagnosis Date  . Kidney stones     Past Surgical History:  Procedure Laterality Date  . KIDNEY STONE SURGERY  1994    Family History  Problem Relation Age of Onset  . Heart attack Father   . Cancer Mother        pancreatic  . Cancer Sister        colon    Social History   Socioeconomic History  . Marital status: Single    Spouse name: Not on file  . Number of children: Not on file  . Years of education: Not on file  . Highest education level: Not on file  Occupational History  . Not on file  Tobacco Use  . Smoking status: Former Smoker    Packs/day: 0.25    Years: 45.00    Pack years: 11.25    Types: Cigarettes    Quit date: 03/15/2018    Years since quitting: 1.5  . Smokeless tobacco: Never Used  Substance and Sexual Activity  . Alcohol use: Not Currently    Alcohol/week: 7.0 standard drinks    Types: 7 Cans of beer per week  . Drug use: Not Currently  . Sexual activity: Not Currently  Other Topics Concern  . Not on file  Social History Narrative  . Not on file   Social Determinants of  Health   Financial Resource Strain:   . Difficulty of Paying Living Expenses:   Food Insecurity:   . Worried About Charity fundraiser in the Last Year:   . Arboriculturist in the Last Year:   Transportation Needs:   . Film/video editor (Medical):   Marland Kitchen Lack of Transportation (Non-Medical):   Physical Activity:   . Days of Exercise per Week:   . Minutes of Exercise per Session:   Stress:   . Feeling of Stress :   Social Connections:   . Frequency of Communication with Friends and Family:   . Frequency of Social Gatherings with Friends and Family:   . Attends Religious Services:   . Active Member of Clubs or Organizations:   . Attends Archivist Meetings:   Marland Kitchen Marital Status:   Intimate Partner Violence:   . Fear of Current or Ex-Partner:   . Emotionally Abused:   Marland Kitchen Physically Abused:   . Sexually Abused:     No outpatient medications prior to visit.   No facility-administered medications prior to visit.    Allergies  Allergen Reactions  . Sulfa Antibiotics Hives    ROS Review of Systems  A fourteen system review  of systems was performed and found to be positive as per HPI.   Objective:    Physical Exam  General: In no apparent distress. Eyes: PERRLA, EOMs, conjunctiva clr Resp: Respiratory effort- normal, ECTA B/L w/o W/R/R  Cardio: RRR Abdomen: no gross distention. Soft, NBS with diffuse tenderness to lower abdomen. Negative Murphy's and Rovsing's sign.  Lymphatics:  less 2 sec cap RF M-sk: Full ROM, 5/5 strength, normal gait.  Skin: Warm, dry. Small, flat and pink macules noted on upper extremity and trunk. Pink plaques with silvery scales noted on extensor surfaces of upper extremities. Neuro: Alert, Oriented Psych: Normal affect, Insight and Judgment appropriate.   BP (!) 88/54   Pulse 93   Temp 97.9 F (36.6 C) (Oral)   Ht '5\' 6"'  (1.676 m)   Wt 121 lb 1.6 oz (54.9 kg)   SpO2 99% Comment: on RA  BMI 19.55 kg/m  Wt Readings from Last  3 Encounters:  10/17/19 121 lb 1.6 oz (54.9 kg)  10/15/17 125 lb 9.6 oz (57 kg)  07/31/17 150 lb (68 kg)     Health Maintenance Due  Topic Date Due  . Hepatitis C Screening  Never done  . COVID-19 Vaccine (1) Never done  . TETANUS/TDAP  Never done  . COLONOSCOPY  Never done  . PNA vac Low Risk Adult (1 of 2 - PCV13) Never done    There are no preventive care reminders to display for this patient.  No results found for: TSH Lab Results  Component Value Date   WBC 3.6 (L) 07/31/2017   HGB 13.7 07/31/2017   HCT 39.4 (L) 07/31/2017   MCV 86.4 07/31/2017   PLT 172 07/31/2017   Lab Results  Component Value Date   NA 139 07/31/2017   K 3.7 07/31/2017   CO2 29 07/31/2017   GLUCOSE 85 07/31/2017   BUN 24 (H) 07/31/2017   CREATININE 0.94 07/31/2017   CALCIUM 8.5 (L) 07/31/2017   ANIONGAP 7 07/31/2017   No results found for: CHOL No results found for: HDL No results found for: LDLCALC No results found for: TRIG No results found for: CHOLHDL No results found for: HGBA1C    Assessment & Plan:   Problem List Items Addressed This Visit    None    Visit Diagnoses    Lower abdominal pain    -  Primary   Relevant Orders   Comp Met (CMET)   CBC   Lipase   US Abdomen Complete   Ambulatory referral to Gastroenterology   Frequent bowel movements       Relevant Orders   US Abdomen Complete   Ambulatory referral to Gastroenterology   Loose stools       Relevant Orders   Lipase   US Abdomen Complete   Ambulatory referral to Gastroenterology   Tick bite, initial encounter       Relevant Orders   Lyme Ab (IgG/M) + RMSF( IgG/M)   Family history of pancreatic cancer       Relevant Orders   Lipase   US Abdomen Complete   Ambulatory referral to Gastroenterology     Lower abdominal pain, frequent bowel movements, loose stools: Marland Kitchen- Placed order for abdomen US, CBC, CMP, and lipase to evaluate for possible etiologies.  - Will place referral to gastroenterology for further  evaluation and management for possible IBS or IBD. Pt requested referral to be in [place]. - Pt instructed to return stool samples for H. Pylori testing.  Tick Bite: -  Placed order for Lyme and RMSF testing given history of recent tick bite, one episode of fever and chills, and abdominal pain.  Psoriasis: - Pt reports history of psoriasis and on PE rash noted on bilateral elbows. - Pt declined referral to dermatology.   No orders of the defined types were placed in this encounter.   Follow-up: Return if symptoms worsen or fail to improve, for [facility]W in 4 months.    Lorrene Reid, PA-C

## 2019-10-17 ENCOUNTER — Other Ambulatory Visit: Payer: Self-pay

## 2019-10-17 ENCOUNTER — Encounter: Payer: Self-pay | Admitting: Physician Assistant

## 2019-10-17 ENCOUNTER — Ambulatory Visit (INDEPENDENT_AMBULATORY_CARE_PROVIDER_SITE_OTHER): Payer: Medicare Other | Admitting: Physician Assistant

## 2019-10-17 VITALS — BP 88/54 | HR 93 | Temp 97.9°F | Ht 66.0 in | Wt 121.1 lb

## 2019-10-17 DIAGNOSIS — R195 Other fecal abnormalities: Secondary | ICD-10-CM | POA: Diagnosis not present

## 2019-10-17 DIAGNOSIS — W57XXXA Bitten or stung by nonvenomous insect and other nonvenomous arthropods, initial encounter: Secondary | ICD-10-CM | POA: Diagnosis not present

## 2019-10-17 DIAGNOSIS — R194 Change in bowel habit: Secondary | ICD-10-CM | POA: Diagnosis not present

## 2019-10-17 DIAGNOSIS — R1030 Lower abdominal pain, unspecified: Secondary | ICD-10-CM

## 2019-10-17 DIAGNOSIS — Z800 Family history of malignant neoplasm of digestive organs: Secondary | ICD-10-CM

## 2019-10-17 DIAGNOSIS — L409 Psoriasis, unspecified: Secondary | ICD-10-CM

## 2019-10-17 NOTE — Patient Instructions (Signed)
Abdominal Pain, Adult Pain in the abdomen (abdominal pain) can be caused by many things. Often, abdominal pain is not serious and it gets better with no treatment or by being treated at home. However, sometimes abdominal pain is serious. Your health care provider will ask questions about your medical history and do a physical exam to try to determine the cause of your abdominal pain. Follow these instructions at home:  Medicines  Take over-the-counter and prescription medicines only as told by your health care provider.  Do not take a laxative unless told by your health care provider. General instructions  Watch your condition for any changes.  Drink enough fluid to keep your urine pale yellow.  Keep all follow-up visits as told by your health care provider. This is important. Contact a health care provider if:  Your abdominal pain changes or gets worse.  You are not hungry or you lose weight without trying.  You are constipated or have diarrhea for more than 2-3 days.  You have pain when you urinate or have a bowel movement.  Your abdominal pain wakes you up at night.  Your pain gets worse with meals, after eating, or with certain foods.  You are vomiting and cannot keep anything down.  You have a fever.  You have blood in your urine. Get help right away if:  Your pain does not go away as soon as your health care provider told you to expect.  You cannot stop vomiting.  Your pain is only in areas of the abdomen, such as the right side or the left lower portion of the abdomen. Pain on the right side could be caused by appendicitis.  You have bloody or black stools, or stools that look like tar.  You have severe pain, cramping, or bloating in your abdomen.  You have signs of dehydration, such as: ? Dark urine, very little urine, or no urine. ? Cracked lips. ? Dry mouth. ? Sunken eyes. ? Sleepiness. ? Weakness.  You have trouble breathing or chest  pain. Summary  Often, abdominal pain is not serious and it gets better with no treatment or by being treated at home. However, sometimes abdominal pain is serious.  Watch your condition for any changes.  Take over-the-counter and prescription medicines only as told by your health care provider.  Contact a health care provider if your abdominal pain changes or gets worse.  Get help right away if you have severe pain, cramping, or bloating in your abdomen. This information is not intended to replace advice given to you by your health care provider. Make sure you discuss any questions you have with your health care provider. Document Revised: 10/10/2018 Document Reviewed: 10/10/2018 Elsevier Patient Education  2020 Elsevier Inc.  

## 2019-10-18 LAB — COMPREHENSIVE METABOLIC PANEL
ALT: 22 IU/L (ref 0–44)
AST: 21 IU/L (ref 0–40)
Albumin/Globulin Ratio: 1.5 (ref 1.2–2.2)
Albumin: 4.2 g/dL (ref 3.8–4.8)
Alkaline Phosphatase: 101 IU/L (ref 39–117)
BUN/Creatinine Ratio: 25 — ABNORMAL HIGH (ref 10–24)
BUN: 24 mg/dL (ref 8–27)
Bilirubin Total: 0.3 mg/dL (ref 0.0–1.2)
CO2: 23 mmol/L (ref 20–29)
Calcium: 9.1 mg/dL (ref 8.6–10.2)
Chloride: 103 mmol/L (ref 96–106)
Creatinine, Ser: 0.96 mg/dL (ref 0.76–1.27)
GFR calc Af Amer: 92 mL/min/{1.73_m2} (ref >59)
GFR calc non Af Amer: 80 mL/min/{1.73_m2} (ref >59)
Globulin, Total: 2.8 g/dL (ref 1.5–4.5)
Glucose: 90 mg/dL (ref 65–99)
Potassium: 4.3 mmol/L (ref 3.5–5.2)
Sodium: 139 mmol/L (ref 134–144)
Total Protein: 7 g/dL (ref 6.0–8.5)

## 2019-10-18 LAB — CBC
Hematocrit: 34.3 % — ABNORMAL LOW (ref 37.5–51.0)
Hemoglobin: 11.9 g/dL — ABNORMAL LOW (ref 13.0–17.7)
MCH: 28.8 pg (ref 26.6–33.0)
MCHC: 34.7 g/dL (ref 31.5–35.7)
MCV: 83 fL (ref 79–97)
Platelets: 392 10*3/uL (ref 150–450)
RBC: 4.13 x10E6/uL — ABNORMAL LOW (ref 4.14–5.80)
RDW: 15.3 % (ref 11.6–15.4)
WBC: 6.8 10*3/uL (ref 3.4–10.8)

## 2019-10-18 LAB — LIPASE: Lipase: 30 U/L (ref 13–78)

## 2019-10-19 LAB — LYMEAB(IGG/M)+RMSF(IGG/M)
LYME DISEASE AB, QUANT, IGM: <0.8 index (ref 0.00–0.79)
Lyme IgG/IgM Ab: <0.91 {ISR} (ref 0.00–0.90)
RMSF IgG: NEGATIVE
RMSF IgM: 0.2 index (ref 0.00–0.89)

## 2019-10-23 ENCOUNTER — Ambulatory Visit: Admission: RE | Admit: 2019-10-23 | Payer: Medicare Other | Source: Ambulatory Visit

## 2019-10-23 ENCOUNTER — Telehealth: Payer: Self-pay | Admitting: Physician Assistant

## 2019-10-23 NOTE — Telephone Encounter (Signed)
Called [facility] and spoke with [facility]. Unfortunately we are unable to add Retic count. This lab has to be run within 72 hours and we have passed this window of time. AS, CMA

## 2019-10-23 NOTE — Telephone Encounter (Signed)
-----   Message from Lorrene Reid, [place] sent at 10/20/2019  3:59 PM EDT ----- Jefm Bryant,  Please call [facility] and see if you can add a reticulocyte count to existing labs?  Thank you, Herb Grays

## 2019-10-31 ENCOUNTER — Telehealth: Payer: Self-pay | Admitting: Physician Assistant

## 2019-10-31 ENCOUNTER — Ambulatory Visit: Payer: Medicare Other | Attending: Physician Assistant

## 2019-10-31 NOTE — Telephone Encounter (Signed)
-----   Message from Lorrene Reid, [place] sent at 10/25/2019  8:17 AM EDT ----- Wayne Mccarty [employer],  Please call patient and let him know most of his labs are essentially within normal limits except CBC- RBC count, hemoglobin and hematocrit decreased indicating anemia. Please inquire if patient is willing/able to come in for lab visit to get additional test (retic count) if not, then will defer to gastroenterology for additional evaluation. Patient was also given stool supplies for H. Pylori testing, inquire if he has completed yet.  Thank you, Herb Grays

## 2019-11-02 ENCOUNTER — Other Ambulatory Visit: Payer: Self-pay

## 2019-11-02 ENCOUNTER — Other Ambulatory Visit: Payer: Medicare Other

## 2019-11-02 DIAGNOSIS — R195 Other fecal abnormalities: Secondary | ICD-10-CM

## 2019-11-02 DIAGNOSIS — R1030 Lower abdominal pain, unspecified: Secondary | ICD-10-CM

## 2019-11-02 DIAGNOSIS — R194 Change in bowel habit: Secondary | ICD-10-CM

## 2019-11-03 LAB — H. PYLORI ANTIGEN, STOOL

## 2019-11-03 LAB — SPECIMEN STATUS REPORT

## 2019-11-20 ENCOUNTER — Ambulatory Visit: Payer: Medicare Other

## 2019-11-20 ENCOUNTER — Telehealth: Payer: Self-pay | Admitting: *Deleted

## 2019-11-20 NOTE — Telephone Encounter (Signed)
Patient called over the weekend about kidney stone. He was told to go to ER. Tried to call him this morning and the voice mail is full.

## 2019-12-25 ENCOUNTER — Ambulatory Visit
Admission: RE | Admit: 2019-12-25 | Discharge: 2019-12-25 | Disposition: A | Discharge status: Home / Self Care | Payer: Medicare Other | Source: Ambulatory Visit | Attending: Physician Assistant | Admitting: Physician Assistant

## 2019-12-25 ENCOUNTER — Other Ambulatory Visit: Payer: Self-pay

## 2019-12-25 DIAGNOSIS — Z800 Family history of malignant neoplasm of digestive organs: Secondary | ICD-10-CM | POA: Diagnosis present

## 2019-12-25 DIAGNOSIS — R195 Other fecal abnormalities: Secondary | ICD-10-CM | POA: Diagnosis present

## 2019-12-25 DIAGNOSIS — R194 Change in bowel habit: Secondary | ICD-10-CM | POA: Insufficient documentation

## 2019-12-25 DIAGNOSIS — R1030 Lower abdominal pain, unspecified: Secondary | ICD-10-CM | POA: Insufficient documentation

## 2020-01-02 ENCOUNTER — Ambulatory Visit (INDEPENDENT_AMBULATORY_CARE_PROVIDER_SITE_OTHER): Payer: Medicare Other | Admitting: Gastroenterology

## 2020-01-02 ENCOUNTER — Other Ambulatory Visit: Payer: Self-pay

## 2020-01-02 ENCOUNTER — Encounter: Payer: Self-pay | Admitting: Gastroenterology

## 2020-01-02 VITALS — BP 110/64 | HR 78 | Temp 98.3°F | Ht 72.0 in | Wt 115.0 lb

## 2020-01-02 DIAGNOSIS — R634 Abnormal weight loss: Secondary | ICD-10-CM

## 2020-01-02 DIAGNOSIS — R197 Diarrhea, unspecified: Secondary | ICD-10-CM

## 2020-01-02 NOTE — Patient Instructions (Signed)
Miralax 17 Grams daily. This could be bought at you local pharmacy with no prescription.   Psyllium granules or powder for solution What is this medicine? PSYLLIUM (SIL i yum) is a bulk-forming fiber laxative. This medicine is used to treat constipation. Increasing fiber in the diet may also help lower cholesterol and promote heart health for some people. This medicine may be used for other purposes; ask your health care provider or pharmacist if you have questions. COMMON BRAND NAME(S): Fiber Therapy, GenFiber, Geri-Mucil, Hydrocil, Konsyl, Metamucil, Metamucil MultiHealth, Mucilin, Natural Fiber Therapy, Reguloid What should I tell my health care provider before I take this medicine? They need to know if you have any of these conditions:  blockage in your bowel  difficulty swallowing  inflammatory bowel disease  phenylketonuria  stomach or intestine problems  sudden change in bowel habits lasting more than 2 weeks  an unusual or allergic reaction to psyllium, other medicines, dyes, or preservatives  pregnant or trying or get pregnant  breast-feeding How should I use this medicine? Mix this medicine into a full glass (240 mL) of water or other cool drink. Take this medicine by mouth. Follow the directions on the package labeling, or take as directed by your health care professional. Take your medicine at regular intervals. Do not take your medicine more often than directed. Talk to your pediatrician regarding the use of this medicine in children. While this drug may be prescribed for children as young as 12 years old for selected conditions, precautions do apply. Overdosage: If you think you have taken too much of this medicine contact a poison control center or emergency room at once. NOTE: This medicine is only for you. Do not share this medicine with others. What if I miss a dose? If you miss a dose, take it as soon as you can. If it is almost time for your next dose, take only  that dose. Do not take double or extra doses. What may interact with this medicine? Interactions are not expected. Take this product at least 2 hours before or after other medicines. This list may not describe all possible interactions. Give your health care provider a list of all the medicines, herbs, non-prescription drugs, or dietary supplements you use. Also tell them if you smoke, drink alcohol, or use illegal drugs. Some items may interact with your medicine. What should I watch for while using this medicine? Check with your doctor or health care professional if your symptoms do not start to get better or if they get worse. Stop using this medicine and contact your doctor or health care professional if you have rectal bleeding or if you have to treat your constipation for more than 1 week. These could be signs of a more serious condition. Drink several glasses of water a day while you are taking this medicine. This will help to relieve constipation and prevent dehydration. What side effects may I notice from receiving this medicine? Side effects that you should report to your doctor or health care professional as soon as possible:  allergic reactions like skin rash, itching or hives, swelling of the face, lips, or tongue  breathing problems  chest pain  nausea, vomiting  rectal bleeding  trouble swallowing Side effects that usually do not require medical attention (report to your doctor or health care professional if they continue or are bothersome):  bloating  gas  stomach cramps This list may not describe all possible side effects. Call your doctor for medical advice about  side effects. You may report side effects to FDA at 1-800-FDA-1088. Where should I keep my medicine? Keep out of the reach of children. Store at room temperature between 15 and 30 degrees C (59 and 86 degrees F). Protect from moisture. Throw away any unused medicine after the expiration date. NOTE: This  sheet is a summary. It may not cover all possible information. If you have questions about this medicine, talk to your doctor, pharmacist, or health care provider.  2020 Elsevier/Gold Standard (2017-10-26 15:41:08)

## 2020-01-03 ENCOUNTER — Telehealth: Payer: Self-pay | Admitting: Physician Assistant

## 2020-01-03 NOTE — Telephone Encounter (Signed)
Attempted to contact patient again with no answer. Unable to leave voice mail. Sending letter in mail. AS, CMA

## 2020-01-03 NOTE — Telephone Encounter (Signed)
-----   Message from Lorrene Reid, [place] sent at 01/01/2020 12:36 PM EDT ----- Please call Mr. Train and notify us revealed no abnormalities of gallbladder or liver, does show 2.8 cm kidney stone and abdominal aorta at risk for developing into an aneurysm and recommendations are to repeat US in 5 years. Recommend to follow-up with gastroenterology for GI symptoms, referral was placed at his [place] 10/2019. Recommend referral to urologist for kidney stone management.   Thank you, Herb Grays

## 2020-01-03 NOTE — Progress Notes (Signed)
Wayne Mccarty [address]  [place],  [address]  Main: [phone]  Fax: [phone]   Gastroenterology Consultation  Referring Provider:     Lorrene Reid, PA-C Primary Care Physician:  Lorrene Reid, PA-C Reason for Consultation:   Diarrhea        HPI:    Chief Complaint  Patient presents with  . New Patient (Initial Visit)  . Low abdominal pain  . Diarrhea    Wayne Mccarty is a 69 y.o. y/o male referred for consultation & management  by Dr. Lorrene Reid, PA-C.  Patient reports multiple loose bowels, 4-6, a day without blood, for 6 months.  Patient appears cachectic.  No nausea or vomiting.  No prior EGD or colonoscopy.  Denies dysphagia.  Reports abdominal cramping associated with his diarrhea.  Past Medical History:  Diagnosis Date  . Kidney stones     Past Surgical History:  Procedure Laterality Date  . [place]    Prior to Admission medications   Medication Sig Start Date End Date Taking? Authorizing Provider  esomeprazole (NEXIUM) 20 MG packet Take 20 mg by mouth daily before breakfast.   Yes [provider]    Family History  Problem Relation Age of Onset  . Heart attack Father   . Cancer Mother        pancreatic  . Cancer Sister        colon     Social History   Tobacco Use  . Smoking status: Former Smoker    Packs/day: 0.25    Years: 45.00    Pack years: 11.25    Types: Cigarettes    Quit date: 03/15/2018    Years since quitting: 1.8  . Smokeless tobacco: Never Used  Vaping Use  . Vaping Use: Never used  Substance Use Topics  . Alcohol use: Not Currently    Alcohol/week: 7.0 standard drinks    Types: 7 Cans of beer per week  . Drug use: Not Currently    Allergies as of 01/02/2020 - Review Complete 01/02/2020  Allergen Reaction Noted  . Sulfa antibiotics Hives 07/31/2017    Review of Systems:    All systems reviewed and negative except where noted in HPI.    Physical Exam:  BP 110/64   Pulse 78   Temp 98.3 F (36.8 C) (Oral)   Ht 6' (1.829 m)   Wt 115 lb (52.2 kg)   BMI 15.60 kg/m  No LMP for male patient. Psych:  Alert and cooperative. Normal mood and affect. General:   Alert,  Well-developed, well-nourished, pleasant and cooperative in NAD Head:  Normocephalic and atraumatic. Eyes:  Sclera clear, no icterus.   Conjunctiva pink. Ears:  Normal auditory acuity. Nose:  No deformity, discharge, or lesions. Mouth:  No deformity or lesions,oropharynx pink & moist. Neck:  Supple; no masses or thyromegaly. Abdomen:  Normal bowel sounds.  No bruits.  Soft, non-tender and non-distended without masses, hepatosplenomegaly or hernias noted.  No guarding or rebound tenderness.    Msk:  Symmetrical without gross deformities. Good, equal movement & strength bilaterally. Pulses:  Normal pulses noted. Extremities:  No clubbing or edema.  No cyanosis. Neurologic:  Alert and oriented x3;  grossly normal neurologically. Skin:  Intact without significant lesions or rashes. No jaundice. Lymph Nodes:  No significant cervical adenopathy. Psych:  Alert and cooperative. Normal mood and affect.   Labs: CBC    Component Value Date/Time   WBC 6.8  10/17/2019 1204   WBC 3.6 (L) 07/31/2017 2040   RBC 4.13 (L) 10/17/2019 1204   RBC 4.56 07/31/2017 2040   HGB 11.9 (L) 10/17/2019 1204   HCT 34.3 (L) 10/17/2019 1204   PLT 392 10/17/2019 1204   MCV 83 10/17/2019 1204   MCH 28.8 10/17/2019 1204   MCH 30.0 07/31/2017 2040   MCHC 34.7 10/17/2019 1204   MCHC 34.7 07/31/2017 2040   RDW 15.3 10/17/2019 1204   CMP     Component Value Date/Time   NA 139 10/17/2019 1204   K 4.3 10/17/2019 1204   CL 103 10/17/2019 1204   CO2 23 10/17/2019 1204   GLUCOSE 90 10/17/2019 1204   GLUCOSE 85 07/31/2017 2040   BUN 24 10/17/2019 1204   CREATININE 0.96 10/17/2019 1204   CALCIUM 9.1 10/17/2019 1204   PROT 7.0 10/17/2019 1204   ALBUMIN 4.2 10/17/2019 1204   AST 21  10/17/2019 1204   ALT 22 10/17/2019 1204   ALKPHOS 101 10/17/2019 1204   BILITOT 0.3 10/17/2019 1204   GFRNONAA 80 10/17/2019 1204   GFRAA 92 10/17/2019 1204    Imaging Studies: US Abdomen Complete  Result Date: 12/26/2019 CLINICAL DATA:  Initial evaluation for abdominal pain with increased bowel movements for 2 months. EXAM: ABDOMEN ULTRASOUND COMPLETE COMPARISON:  Prior CT from 08/01/2017. FINDINGS: Gallbladder: No gallstones or wall thickening visualized. No sonographic Murphy sign noted by sonographer. Common bile duct: Diameter: 3.4 mm Liver: No focal lesion identified. Within normal limits in parenchymal echogenicity. Portal vein is patent on color Doppler imaging with normal direction of blood flow towards the liver. IVC: No abnormality visualized. Pancreas: Visualized portion unremarkable. Spleen: Size and appearance within normal limits. Right Kidney: Length: 2.8 cm. Renal echogenicity within normal limits. 2.8 cm shadowing staghorn calculus present at the right renal pelvis. Secondary mild hydronephrosis at the upper pole. Probable few additional echogenic calculi noted as well. No focal renal mass. Left Kidney: Length: 12.1 cm. Echogenicity within normal limits. No nephrolithiasis or hydronephrosis. 1.2 x 1.2 x 1.3 cm simple cyst present within the interpolar region. Abdominal aorta: Infrarenal aorta of measures up to 2.9 cm in diameter. Other findings: None. IMPRESSION: 1. 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 Secondary mild hydronephrosis. 2. 1.3 cm simple left renal cyst. 3. 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 Recommend followup by ultrasound in 5 years. This recommendation follows ACR consensus guidelines: White Paper of the ACR Incidental Findings Committee II on Vascular Findings. J Am Coll Radiol 2013; 10:789-794. 4. Otherwise negative abdominal ultrasound. No other acute  abnormality or findings to explain patient's symptoms identified. Electronically Signed   By: Jeannine Boga M.D.   On: 12/26/2019 01:20    Assessment and Plan:   Wayne Mccarty is a 69 y.o. y/o male has been referred for diarrhea, and weight loss  Patient appears cachectic and given his weight loss and no prior EGD and colonoscopy.  I have recommended both  After discussing these in detail, with risks and benefits and benefits of ruling out malignancy and risk of underlying malignancy being missed if procedures are not done discussed in detail.  Patient does not want to schedule at this time  However, he is willing to proceed with stool testing and infectious stool panel was ordered  Patient was encouraged to call us if he changes in mind about getting an EGD and colonoscopy for diarrhea and  weight loss to rule out microscopic colitis and malignancy  Dr Wayne Mccarty  Speech recognition software was used to dictate the above note.

## 2020-02-13 ENCOUNTER — Telehealth: Payer: Self-pay

## 2020-02-13 NOTE — Telephone Encounter (Signed)
Patient is calling about results lab work. Patient is wanting to know ultrasound.

## 2020-02-13 NOTE — Telephone Encounter (Signed)
Called patient but had to leave a detailed message letting him know that Dr. Bonna Gains was not the one who ordered his Ultrasound done in July 2021. I let him know that it was Lorrene Reid, PA-C and that he will need to call her and ask for results. I also stated that if he had further questions to please call us back.

## 2020-02-14 ENCOUNTER — Other Ambulatory Visit: Payer: Self-pay

## 2020-02-19 LAB — STOOL CULTURE: E coli, Shiga toxin Assay: NEGATIVE

## 2020-02-22 ENCOUNTER — Telehealth: Payer: Self-pay | Admitting: Physician Assistant

## 2020-02-22 DIAGNOSIS — N200 Calculus of kidney: Secondary | ICD-10-CM

## 2020-02-22 NOTE — Telephone Encounter (Signed)
Patient is aware of ultrasound results. Referral to urologist placed. AS, CMA

## 2020-02-22 NOTE — Telephone Encounter (Signed)
Attempted to call patient with no answer. Unable to leave voicemail. AS, CMA

## 2020-02-22 NOTE — Telephone Encounter (Signed)
Patient called in requesting to know about results from a visit in [place]. Please call [phone].

## 2020-02-22 NOTE — Addendum Note (Signed)
Addended by: Mickel Crow on: 02/22/2020 04:40 PM   Modules accepted: Orders

## 2020-02-28 ENCOUNTER — Telehealth: Payer: Self-pay

## 2020-02-28 NOTE — Telephone Encounter (Signed)
Patient called asking for his stool sample results.

## 2020-02-28 NOTE — Telephone Encounter (Signed)
Called patient back at his home phone but his voicemail is full and can not accept any messages at this time. I then called his cell phone and left him a detailed message letting him know that his stool results were negative. However, if he was still having symptoms, then to let us know so we could schedule his colonoscopy as recommended by Dr. Bonna Gains when he was seen on 01/02/2020.

## 2020-02-29 ENCOUNTER — Encounter: Payer: Self-pay | Admitting: Urology

## 2020-02-29 ENCOUNTER — Telehealth: Payer: Self-pay

## 2020-02-29 NOTE — Telephone Encounter (Signed)
Patient called stating that he was seen in January 02, 2020 and had turned in his stool sample and everything have been good. However, he stated that he continues to have diarrhea and that his way of living have changed due to not being able to do much of nothing because he is afraid to have an accident. Patient stated that he had been dealing with this problem for 6 months and would like answers. Patient stated that he is tired of taking Pepto Bismol and that he doesn't want to continue taking it. I recommended for him to do the EGD and colonoscopy as recommended on his office visit with the provider and he refused again. He stated that he did not needed this but is willing to try something else other than Pepto Bismol to help with his diarrhea. I told him that the provider would like for him to have the procedures in order for her to know what to work with. Patient stated to talk to the provider first and see what she recommends besides doing the procedures. Please advise.

## 2020-03-08 NOTE — Telephone Encounter (Signed)
Made televisit for pt on 9.28.21. Left vm on pt mobile number to call back and confirm appt.

## 2020-03-08 NOTE — Telephone Encounter (Signed)
Meghan, can you please call patient and schedule him a phone appointment with Dr. Bonna Gains for Tuesday 03/12/2020 at 10:15 AM per Dr. Bonna Gains. Thank you!

## 2020-03-12 ENCOUNTER — Telehealth: Payer: Medicare Other | Admitting: Gastroenterology

## 2020-03-12 ENCOUNTER — Other Ambulatory Visit: Payer: Self-pay

## 2020-03-29 ENCOUNTER — Ambulatory Visit: Payer: Medicare Other | Admitting: Urology

## 2020-05-16 ENCOUNTER — Telehealth: Payer: Self-pay | Admitting: Physician Assistant

## 2020-05-16 NOTE — Telephone Encounter (Signed)
Patient is having issues w/sleeping, frequent urination and diarrhea. Still losing weight, muscle tone and overall feels exhausted.  Wants to know what his next steps are.  Please Advise!!

## 2020-05-16 NOTE — Telephone Encounter (Signed)
Patient states he has been having severe diarrhea and frequent urination and "feels like Im not going to make it much longer". Patient states he feels very weak and like he cant make it to the mailbox without "falling out".   I advised patient that he should go to UC or the ED for evaluation and treatment since we do not have any open apts in our office at this time until January. Patient verbalized understanding and was agreeable.

## 2020-06-14 ENCOUNTER — Other Ambulatory Visit: Payer: Self-pay

## 2020-06-14 ENCOUNTER — Encounter: Payer: Self-pay | Admitting: Emergency Medicine

## 2020-06-14 ENCOUNTER — Emergency Department
Admission: EM | Admit: 2020-06-14 | Discharge: 2020-06-15 | Disposition: A | Discharge status: Home / Self Care | Payer: Medicare Other | Attending: Emergency Medicine | Admitting: Emergency Medicine

## 2020-06-14 DIAGNOSIS — E860 Dehydration: Secondary | ICD-10-CM | POA: Insufficient documentation

## 2020-06-14 DIAGNOSIS — R634 Abnormal weight loss: Secondary | ICD-10-CM | POA: Diagnosis not present

## 2020-06-14 DIAGNOSIS — K56600 Partial intestinal obstruction, unspecified as to cause: Secondary | ICD-10-CM | POA: Diagnosis not present

## 2020-06-14 DIAGNOSIS — Z87891 Personal history of nicotine dependence: Secondary | ICD-10-CM | POA: Insufficient documentation

## 2020-06-14 DIAGNOSIS — R6883 Chills (without fever): Secondary | ICD-10-CM | POA: Insufficient documentation

## 2020-06-14 DIAGNOSIS — R197 Diarrhea, unspecified: Secondary | ICD-10-CM | POA: Insufficient documentation

## 2020-06-14 DIAGNOSIS — R1084 Generalized abdominal pain: Secondary | ICD-10-CM | POA: Insufficient documentation

## 2020-06-14 DIAGNOSIS — K219 Gastro-esophageal reflux disease without esophagitis: Secondary | ICD-10-CM | POA: Diagnosis not present

## 2020-06-14 DIAGNOSIS — R61 Generalized hyperhidrosis: Secondary | ICD-10-CM | POA: Diagnosis not present

## 2020-06-14 DIAGNOSIS — N3000 Acute cystitis without hematuria: Secondary | ICD-10-CM | POA: Insufficient documentation

## 2020-06-14 DIAGNOSIS — C187 Malignant neoplasm of sigmoid colon: Secondary | ICD-10-CM | POA: Insufficient documentation

## 2020-06-14 LAB — CBC
HCT: 21.1 % — ABNORMAL LOW (ref 39.0–52.0)
Hemoglobin: 6.5 g/dL — ABNORMAL LOW (ref 13.0–17.0)
MCH: 21.1 pg — ABNORMAL LOW (ref 26.0–34.0)
MCHC: 30.8 g/dL (ref 30.0–36.0)
MCV: 68.5 fL — ABNORMAL LOW (ref 80.0–100.0)
Platelets: 621 10*3/uL — ABNORMAL HIGH (ref 150–400)
RBC: 3.08 MIL/uL — ABNORMAL LOW (ref 4.22–5.81)
RDW: 15.2 % (ref 11.5–15.5)
WBC: 10.1 10*3/uL (ref 4.0–10.5)
nRBC: 0 % (ref 0.0–0.2)

## 2020-06-14 LAB — URINALYSIS, COMPLETE (UACMP) WITH MICROSCOPIC
Bilirubin Urine: NEGATIVE
Glucose, UA: NEGATIVE mg/dL
Ketones, ur: NEGATIVE mg/dL
Nitrite: NEGATIVE
Protein, ur: 100 mg/dL — AB
Specific Gravity, Urine: 1.021 (ref 1.005–1.030)
WBC, UA: >50 WBC/hpf — ABNORMAL HIGH (ref 0–5)
pH: 5 (ref 5.0–8.0)

## 2020-06-14 LAB — COMPREHENSIVE METABOLIC PANEL
ALT: 11 U/L (ref 0–44)
AST: 15 U/L (ref 15–41)
Albumin: 3.1 g/dL — ABNORMAL LOW (ref 3.5–5.0)
Alkaline Phosphatase: 85 U/L (ref 38–126)
Anion gap: 11 (ref 5–15)
BUN: 22 mg/dL (ref 8–23)
CO2: 23 mmol/L (ref 22–32)
Calcium: 9.4 mg/dL (ref 8.9–10.3)
Chloride: 100 mmol/L (ref 98–111)
Creatinine, Ser: 1.09 mg/dL (ref 0.61–1.24)
GFR, Estimated: >60 mL/min (ref >60)
Glucose, Bld: 96 mg/dL (ref 70–99)
Potassium: 3.6 mmol/L (ref 3.5–5.1)
Sodium: 134 mmol/L — ABNORMAL LOW (ref 135–145)
Total Bilirubin: 0.5 mg/dL (ref 0.3–1.2)
Total Protein: 7.5 g/dL (ref 6.5–8.1)

## 2020-06-14 LAB — LIPASE, BLOOD: Lipase: 24 U/L (ref 11–51)

## 2020-06-14 MED ORDER — LACTATED RINGERS IV BOLUS
1000.0000 mL | Freq: Once | INTRAVENOUS | Status: AC
  Administered 2020-06-15: 1000 mL via INTRAVENOUS

## 2020-06-14 MED ORDER — SODIUM CHLORIDE 0.9 % IV SOLN
1.0000 g | Freq: Once | INTRAVENOUS | Status: AC
  Administered 2020-06-15: 1 g via INTRAVENOUS
  Filled 2020-06-14: qty 10

## 2020-06-14 NOTE — ED Notes (Signed)
ED Provider at bedside. 

## 2020-06-14 NOTE — ED Triage Notes (Signed)
Patient ambulatory to triage with complaints of diarrhea, lower abd pain, and weight loss for over 365 days.  Intermittent dull aching in below umbilicus. Pt reports taking "a couple of Tums a while ago" before coming to ED.  Speaking in complete coherent sentences. No acute breathing distress noted.  Pt appears thin and pale

## 2020-06-14 NOTE — ED Provider Notes (Signed)
[facility] Emergency Department Provider Note ____________________________________________   Event Date/Time   First MD Initiated Contact with Patient 06/14/20 2330     (approximate)  I have reviewed the triage vital signs and the nursing notes.  HISTORY  Chief Complaint Diarrhea, Abdominal Pain, and Weight Loss   HPI Wayne Mccarty is a 69 y.o. malewho presents to the ED for evaluation of diarrhea, weight loss.   Chart review indicates GI consultation for diarrhea over the summer.  Noted to be cachectic at that time and EGD/colonoscopy was recommended but never performed. Patient reports that [place]" and denies regular social contact.  Patient presents to the ED with about 1 year of frequent diarrhea, weight loss and lower abdominal pain.  Diarrhea that occurs hourly with occasional hematochezia and melena.  Patient reports generalized abdominal discomfort, primarily to his diffuse lower quadrants that is cramping and constant.  Patient reports night sweats and chills, but no documented fevers.  Denies dysuria, hematuria or penile discharge.  Denies emesis, chest pain, shortness of breath, cough, syncope or falls.  Past Medical History:  Diagnosis Date  . Kidney stones     Patient Active Problem List   Diagnosis Date Noted  . Fatigue 11/10/2018  . Healthcare maintenance 11/10/2018  . 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 GERD 05/20/2010    Past Surgical History:  Procedure Laterality Date  . KIDNEY STONE SURGERY  1994    Prior to Admission medications   Medication Sig Start Date End Date Taking? Authorizing Provider  esomeprazole (NEXIUM) 20 MG packet Take 20 mg by mouth daily before breakfast.    [provider]    Allergies Sulfa antibiotics  Family History  Problem Relation Age of Onset  . Heart attack Father   . Cancer Mother        pancreatic  . Cancer Sister        colon    Social  History Social History   Tobacco Use  . Smoking status: Former Smoker    Packs/day: 0.25    Years: 45.00    Pack years: 11.25    Types: Cigarettes    Quit date: 03/15/2018    Years since quitting: 2.2  . Smokeless tobacco: Never Used  Vaping Use  . Vaping Use: Never used  Substance Use Topics  . Alcohol use: Not Currently    Alcohol/week: 7.0 standard drinks    Types: 7 Cans of beer per week  . Drug use: Not Currently    Review of Systems  Constitutional: Positive for fever/chills Eyes: No visual changes. ENT: No sore throat. Cardiovascular: Denies chest pain. Respiratory: Denies shortness of breath. Gastrointestinal: , no vomiting. No constipation. Positive for abdominal pain, nausea and diarrhea Genitourinary: Negative for dysuria. Musculoskeletal: Negative for back pain. Skin: Negative for rash. Neurological: Negative for headaches, focal weakness or numbness.  ____________________________________________   PHYSICAL EXAM:  VITAL SIGNS: Vitals:   06/14/20 2108 06/15/20 0035  BP: 131/66 115/84  Pulse: (!) 102 82  Resp: 18 16  Temp: 98.2 F (36.8 C)   SpO2: 100% 100%     Constitutional: Alert and oriented.  Cachectic.  Conversational.  Paranoid about medical interventions. Eyes:  PERRL. EOMI. sunken eyes Head: Atraumatic.  Temporal wasting noted Nose: No congestion/rhinnorhea. Mouth/Throat: Mucous membranes are dry.  Oropharynx non-erythematous. Neck: No stridor. No cervical spine tenderness to palpation. Cardiovascular: Tachycardia rate, regular rhythm. Grossly normal heart sounds.  Good peripheral circulation. Respiratory: Normal  respiratory effort.  No retractions. Lungs CTAB. Gastrointestinal: Soft , nondistended Diffuse tenderness with voluntary guarding Musculoskeletal: No lower extremity tenderness nor edema.  No joint effusions. No signs of acute trauma. Diffuse muscle wasting to the extremities without overlying signs of trauma or  injury. Neurologic:  Normal speech and language. No gross focal neurologic deficits are appreciated. No gait instability noted. Skin:  Skin is warm, dry and intact. No rash noted. Psychiatric: Mood and affect are normal. Speech and behavior are normal.  Paranoid, but with linear thought processes.  ____________________________________________   LABS (all labs ordered are listed, but only abnormal results are displayed)  Labs Reviewed  COMPREHENSIVE METABOLIC PANEL - Abnormal; Notable for the following components:      Result Value   Sodium 134 (*)    Albumin 3.1 (*)    All other components within normal limits  CBC - Abnormal; Notable for the following components:   RBC 3.08 (*)    Hemoglobin 6.5 (*)    HCT 21.1 (*)    MCV 68.5 (*)    MCH 21.1 (*)    Platelets 621 (*)    All other components within normal limits  URINALYSIS, COMPLETE (UACMP) WITH MICROSCOPIC - Abnormal; Notable for the following components:   Color, Urine YELLOW (*)    APPearance CLOUDY (*)    Hgb urine dipstick MODERATE (*)    Protein, ur 100 (*)    Leukocytes,Ua LARGE (*)    WBC, UA >50 (*)    Bacteria, UA RARE (*)    All other components within normal limits  URINE CULTURE  LIPASE, BLOOD  LACTIC ACID, PLASMA  POC SARS CORONAVIRUS 2 AG -  ED  TYPE AND SCREEN  ABO/RH   ____________________________________________  12 Lead EKG  Sinus rhythm, rate of 103 bpm.  Normal axis.  Normal intervals.  Stigmata of LVH.  No evidence of acute ischemia. ____________________________________________  RADIOLOGY  ED MD interpretation: CT abdomen/pelvis reviewed by me with large sigmoid colon mass causing partial small bowel obstruction  Official radiology report(s): CT Angio Abd/Pel W and/or Wo Contrast  Result Date: 06/15/2020 CLINICAL DATA:  Gastrointestinal hemorrhage, hematochezia, infraumbilical abdominal pain EXAM: CTA ABDOMEN AND PELVIS WITHOUT AND WITH CONTRAST TECHNIQUE: Multidetector CT imaging of the  abdomen and pelvis was performed using the standard protocol during bolus administration of intravenous contrast. Multiplanar reconstructed images and MIPs were obtained and reviewed to evaluate the vascular anatomy. CONTRAST:  17mL OMNIPAQUE IOHEXOL 350 MG/ML SOLN COMPARISON:  None. FINDINGS: VASCULAR Aorta: Normal caliber; no aortic aneurysm or dissection. Mild atherosclerotic calcification. Celiac: Widely patent.  Normal anatomic configuration. SMA: Widely patent Renals: Dual right and single left renal arteries are widely patent, demonstrate normal vascular morphology, and demonstrate no evidence of aneurysm. IMA: Widely patent Inflow: Widely patent. Internal iliac arteries are patent bilaterally. Proximal Outflow: Widely patent Veins: Widely patent Review of the MIP images confirms the above findings. NON-VASCULAR Lower chest: The visualized lung bases are clear bilaterally. Hepatobiliary: No focal liver abnormality is seen. No gallstones, gallbladder wall thickening, or biliary dilatation. Pancreas: Unremarkable Spleen: Unremarkable Adrenals/Urinary Tract: The adrenal glands are unremarkable. The kidneys are normal in position. Staghorn calculus is seen within the right kidney with the dominant calculus within the right renal pelvis measuring 1.9 x 2.9 x 2.9 cm. Multiple additional renal calculi are noted within the lower pole which demonstrates asymmetric cortical atrophy. There is mild right hydronephrosis, however, this is likely related to mass effect upon the distal ureter related to  the pelvic mass noted below as opposed to the renal calculus. Simple cortical cyst noted within the left kidney. The bladder is circumferentially thick walled. There are several regions, best noted on sagittal imaging where the fat plane demarcating the bladder from the adjacent colonic mass is obliterated and direct invasion of the structure is not excluded. This is best seen on image # 87/15. The bladder is decompressed.  Stomach/Bowel: There is a a rim enhancing partially centrally necrotic mass arising from the sigmoid colon measuring 12.5 x 9.3 x 8.3 cm on image # 61/7 and 78/15 in keeping with a primary colonic malignancy. This, as noted above, abuts the bladder posteriorly with obliteration of the separating fat plane. Superiorly, this abuts several loops of small bowel j and there is suggestion of direct invasion of a a loop of terminal ileum, best seen on coronal image # 46/14. Posteriorly, the mass abuts the sacrum and likely extends into the left S1 and S2 sacral foramina. The mass results in a partial large bowel obstruction with large volume stool seen upstream. Moderate bilateral inguinal hernias are present containing small bowel. No free intraperitoneal gas or fluid. No pathologic adenopathy within the abdomen. Lymphatic: No pathologic adenopathy identified within the abdomen and pelvis. Reproductive: Prostate is unremarkable. Other: None significant Musculoskeletal: No lytic or blastic bone lesions are identified. IMPRESSION: VASCULAR No active extravasation identified. No vascular normally identified. NON-VASCULAR Large, lobulated, partially necrotic mass compatible with a primary colonic malignancy arising from the sigmoid colon resulting in a partial large bowel obstruction. The mass abuts the sacrum posteriorly and likely extends into the sacral foramina, possibly invades the posterior wall the bladder anteriorly, extends superiorly to abut several loops of small bowel with probable invasion of the a single loop of a distal small bowel. No evidence of regional nodal or distant metastatic disease within the abdomen and pelvis. Extensive right nephrolithiasis. Superimposed mild right hydronephrosis likely related to obstruction of the distal right ureter secondary to the a mass. Aortic Atherosclerosis (ICD10-I70.0). Electronically Signed   By: Fidela Salisbury MD   On: 06/15/2020 02:49     ____________________________________________   PROCEDURES and INTERVENTIONS  Procedure(s) performed (including Critical Care):  .1-3 Lead EKG Interpretation Performed by: Vladimir Crofts, MD Authorized by: Vladimir Crofts, MD     Interpretation: abnormal     ECG rate:  110   ECG rate assessment: tachycardic     Rhythm: sinus tachycardia     Ectopy: none     Conduction: normal   .Critical Care Performed by: Vladimir Crofts, MD Authorized by: Vladimir Crofts, MD   Critical care provider statement:    Critical care time (minutes):  45   Critical care was necessary to treat or prevent imminent or life-threatening deterioration of the following conditions:  Circulatory failure   Critical care was time spent personally by me on the following activities:  Discussions with consultants, evaluation of patient's response to treatment, examination of patient, ordering and performing treatments and interventions, ordering and review of laboratory studies, ordering and review of radiographic studies, pulse oximetry, re-evaluation of patient's condition, obtaining history from patient or surrogate and review of old charts    Medications  lactated ringers bolus 1,000 mL (0 mLs Intravenous Stopped 06/15/20 0315)  cefTRIAXone (ROCEPHIN) 1 g in sodium chloride 0.9 % 100 mL IVPB (0 g Intravenous Stopped 06/15/20 0258)  iohexol (OMNIPAQUE) 350 MG/ML injection 100 mL (100 mLs Intravenous Contrast Given 06/15/20 0145)    ____________________________________________   MDM / ED  COURSE   68 year old male presents to the ED with 1 year of symptoms, found to have a large intra-abdominal mass as the likely primary source, but leaving AMA and refusing treatment.  Patient presented tachycardic but hemodynamically stable, resolving after IV fluids.  Exam demonstrates cachectic gentleman with diffuse lower abdominal pain and voluntary guarding.  Blood work with microcytic anemia with hemoglobin of 6.5, but no sepsis  criteria.  Urine is with infectious features, for which she received a dose of Rocephin.  CT imaging of his abdomen/pelvis reveals significant pathology, documented above, primarily from a large sigmoid mass causing partial small bowel obstruction, infiltrating into his bladder causing his UTI and with obstructive uropathy on the right.  I discussed the patient on multiple occasions my recommendations for blood transfusions, hospital admission but he refuses.  He has capacity to make this decision, and utilizing the teach back method, he indicates understanding that he is likely dying of colon cancer, particularly if he continues to refuse treatment.  We discussed return precautions for the ED.  Patient refuses to take outpatient antibiotics, so they were not prescribed.  Patient leaves AMA.   Clinical Course as of 06/15/20 0316  Sat Jun 15, 2020  0009 Long conversation at the bedside.  Patient is very hesitant and paranoid about any medical interventions and diagnostic procedures.  He adamantly refuses blood transfusion because "I do not know what is in other peoples blood, and I do not want in my body."  We thoroughly discuss multiple signs of critical illness, including urologic infection with stones and possible obstruction considering his history of nephrolithiasis.  We discussed low hemoglobin and my recommendations to transfuse.  Indicates that he thought he could just come in for a couple hours, get some tests and go back home.  We discussed his more serious illness and my recommendation for transfusions, CTA, admission.  We settled on taking this process 1 step at a time, and he is agreeable to CT imaging but no transfusions at this time.  Refuses rectal exam. He has capacity to make these decisions at this time [DS]  0302 Another extensive discussion at the bedside with Joni Reining, RN, present.  I discussed CT findings with the patient, and using the teach back method, he indicates that he understands  that he has extensive colon cancer causing a partial small bowel obstruction and contributing to urinary tract infection.  I further reinforced multiple times my recommendation that patient stay in the hospital, but he indicates that he is ready to go home. [DS]  0306 He explicitly says that he will not take antibiotics if I prescribed them. [DS]    Clinical Course User Index [DS] Delton Prairie, MD    ____________________________________________   FINAL CLINICAL IMPRESSION(S) / ED DIAGNOSES  Final diagnoses:  Diarrhea, unspecified type  Generalized abdominal pain  Dehydration  Weight loss, unintentional  Acute cystitis without hematuria  Malignant neoplasm of sigmoid colon (HCC)  Partial small bowel obstruction [facility])     ED Discharge Orders    None       Garwood Wentzell   Note:  This document was prepared using Dragon voice recognition software and may include unintentional dictation errors.   Delton Prairie, MD 06/15/20 [phone]

## 2020-06-15 ENCOUNTER — Encounter: Payer: Self-pay | Admitting: Radiology

## 2020-06-15 ENCOUNTER — Emergency Department: Payer: Medicare Other

## 2020-06-15 DIAGNOSIS — R6883 Chills (without fever): Secondary | ICD-10-CM | POA: Diagnosis not present

## 2020-06-15 DIAGNOSIS — R1084 Generalized abdominal pain: Secondary | ICD-10-CM | POA: Diagnosis not present

## 2020-06-15 DIAGNOSIS — Z87891 Personal history of nicotine dependence: Secondary | ICD-10-CM | POA: Diagnosis not present

## 2020-06-15 DIAGNOSIS — E860 Dehydration: Secondary | ICD-10-CM | POA: Diagnosis not present

## 2020-06-15 DIAGNOSIS — R61 Generalized hyperhidrosis: Secondary | ICD-10-CM | POA: Diagnosis not present

## 2020-06-15 DIAGNOSIS — N3000 Acute cystitis without hematuria: Secondary | ICD-10-CM | POA: Diagnosis not present

## 2020-06-15 DIAGNOSIS — R197 Diarrhea, unspecified: Secondary | ICD-10-CM | POA: Diagnosis not present

## 2020-06-15 DIAGNOSIS — K219 Gastro-esophageal reflux disease without esophagitis: Secondary | ICD-10-CM | POA: Diagnosis not present

## 2020-06-15 DIAGNOSIS — K56600 Partial intestinal obstruction, unspecified as to cause: Secondary | ICD-10-CM | POA: Diagnosis not present

## 2020-06-15 DIAGNOSIS — C187 Malignant neoplasm of sigmoid colon: Secondary | ICD-10-CM | POA: Diagnosis not present

## 2020-06-15 DIAGNOSIS — R634 Abnormal weight loss: Secondary | ICD-10-CM | POA: Diagnosis not present

## 2020-06-15 LAB — TYPE AND SCREEN
ABO/RH(D): O NEG
Antibody Screen: NEGATIVE

## 2020-06-15 LAB — LACTIC ACID, PLASMA: Lactic Acid, Venous: 1.5 mmol/L (ref 0.5–1.9)

## 2020-06-15 LAB — ABO/RH: ABO/RH(D): O NEG

## 2020-06-15 MED ORDER — IOHEXOL 350 MG/ML SOLN
100.0000 mL | Freq: Once | INTRAVENOUS | Status: AC | PRN
  Administered 2020-06-15: 100 mL via INTRAVENOUS

## 2020-06-15 NOTE — ED Notes (Signed)
ED Provider at bedside.- discussing test results and explaining risks of patient leaving AMA

## 2020-06-15 NOTE — ED Notes (Signed)
Pt acknowledges risks of leaving AMA and is still insistent upon leaving

## 2020-06-15 NOTE — Discharge Instructions (Signed)
Like we talked about, come back to the ED when you get more sick

## 2020-06-16 LAB — URINE CULTURE: Culture: NO GROWTH

## 2020-07-21 ENCOUNTER — Inpatient Hospital Stay: Payer: Medicare Other

## 2020-07-21 ENCOUNTER — Inpatient Hospital Stay
Admission: EM | Admit: 2020-07-21 | Discharge: 2020-08-13 | DRG: 871 | Disposition: E | Payer: Medicare Other | Attending: Pulmonary Disease | Admitting: Pulmonary Disease

## 2020-07-21 ENCOUNTER — Emergency Department: Payer: Medicare Other

## 2020-07-21 DIAGNOSIS — Z20822 Contact with and (suspected) exposure to covid-19: Secondary | ICD-10-CM | POA: Diagnosis present

## 2020-07-21 DIAGNOSIS — Z66 Do not resuscitate: Secondary | ICD-10-CM | POA: Diagnosis not present

## 2020-07-21 DIAGNOSIS — Z882 Allergy status to sulfonamides status: Secondary | ICD-10-CM

## 2020-07-21 DIAGNOSIS — R1900 Intra-abdominal and pelvic swelling, mass and lump, unspecified site: Secondary | ICD-10-CM

## 2020-07-21 DIAGNOSIS — Z515 Encounter for palliative care: Secondary | ICD-10-CM

## 2020-07-21 DIAGNOSIS — Z87891 Personal history of nicotine dependence: Secondary | ICD-10-CM

## 2020-07-21 DIAGNOSIS — N179 Acute kidney failure, unspecified: Secondary | ICD-10-CM | POA: Diagnosis present

## 2020-07-21 DIAGNOSIS — C189 Malignant neoplasm of colon, unspecified: Secondary | ICD-10-CM | POA: Diagnosis present

## 2020-07-21 DIAGNOSIS — A419 Sepsis, unspecified organism: Principal | ICD-10-CM | POA: Diagnosis present

## 2020-07-21 DIAGNOSIS — R680 Hypothermia, not associated with low environmental temperature: Secondary | ICD-10-CM | POA: Diagnosis present

## 2020-07-21 DIAGNOSIS — N131 Hydronephrosis with ureteral stricture, not elsewhere classified: Secondary | ICD-10-CM | POA: Diagnosis present

## 2020-07-21 DIAGNOSIS — R54 Age-related physical debility: Secondary | ICD-10-CM | POA: Diagnosis present

## 2020-07-21 DIAGNOSIS — R6521 Severe sepsis with septic shock: Secondary | ICD-10-CM | POA: Diagnosis present

## 2020-07-21 DIAGNOSIS — J690 Pneumonitis due to inhalation of food and vomit: Secondary | ICD-10-CM

## 2020-07-21 DIAGNOSIS — E8809 Other disorders of plasma-protein metabolism, not elsewhere classified: Secondary | ICD-10-CM | POA: Diagnosis present

## 2020-07-21 DIAGNOSIS — D649 Anemia, unspecified: Secondary | ICD-10-CM

## 2020-07-21 DIAGNOSIS — R935 Abnormal findings on diagnostic imaging of other abdominal regions, including retroperitoneum: Secondary | ICD-10-CM | POA: Diagnosis not present

## 2020-07-21 DIAGNOSIS — Z681 Body mass index (BMI) 19 or less, adult: Secondary | ICD-10-CM

## 2020-07-21 DIAGNOSIS — Z800 Family history of malignant neoplasm of digestive organs: Secondary | ICD-10-CM | POA: Diagnosis not present

## 2020-07-21 DIAGNOSIS — E43 Unspecified severe protein-calorie malnutrition: Secondary | ICD-10-CM | POA: Diagnosis present

## 2020-07-21 DIAGNOSIS — Z8249 Family history of ischemic heart disease and other diseases of the circulatory system: Secondary | ICD-10-CM

## 2020-07-21 DIAGNOSIS — R140 Abdominal distension (gaseous): Secondary | ICD-10-CM

## 2020-07-21 DIAGNOSIS — J189 Pneumonia, unspecified organism: Secondary | ICD-10-CM | POA: Diagnosis present

## 2020-07-21 DIAGNOSIS — E860 Dehydration: Secondary | ICD-10-CM | POA: Diagnosis present

## 2020-07-21 DIAGNOSIS — R627 Adult failure to thrive: Secondary | ICD-10-CM | POA: Diagnosis present

## 2020-07-21 DIAGNOSIS — R64 Cachexia: Secondary | ICD-10-CM | POA: Diagnosis present

## 2020-07-21 DIAGNOSIS — D500 Iron deficiency anemia secondary to blood loss (chronic): Secondary | ICD-10-CM | POA: Diagnosis present

## 2020-07-21 DIAGNOSIS — K56609 Unspecified intestinal obstruction, unspecified as to partial versus complete obstruction: Secondary | ICD-10-CM

## 2020-07-21 DIAGNOSIS — Z0189 Encounter for other specified special examinations: Secondary | ICD-10-CM

## 2020-07-21 DIAGNOSIS — R6520 Severe sepsis without septic shock: Secondary | ICD-10-CM

## 2020-07-21 DIAGNOSIS — G893 Neoplasm related pain (acute) (chronic): Secondary | ICD-10-CM | POA: Diagnosis not present

## 2020-07-21 LAB — CBC WITH DIFFERENTIAL/PLATELET
Abs Immature Granulocytes: 0.15 10*3/uL — ABNORMAL HIGH (ref 0.00–0.07)
Basophils Absolute: 0.1 10*3/uL (ref 0.0–0.1)
Basophils Relative: 1 %
Eosinophils Absolute: 0 10*3/uL (ref 0.0–0.5)
Eosinophils Relative: 0 %
HCT: 19.6 % — ABNORMAL LOW (ref 39.0–52.0)
Hemoglobin: 5.7 g/dL — ABNORMAL LOW (ref 13.0–17.0)
Immature Granulocytes: 1 %
Lymphocytes Relative: 3 %
Lymphs Abs: 0.5 10*3/uL — ABNORMAL LOW (ref 0.7–4.0)
MCH: 18.9 pg — ABNORMAL LOW (ref 26.0–34.0)
MCHC: 29.1 g/dL — ABNORMAL LOW (ref 30.0–36.0)
MCV: 64.9 fL — ABNORMAL LOW (ref 80.0–100.0)
Monocytes Absolute: 1.1 10*3/uL — ABNORMAL HIGH (ref 0.1–1.0)
Monocytes Relative: 6 %
Neutro Abs: 17.4 10*3/uL — ABNORMAL HIGH (ref 1.7–7.7)
Neutrophils Relative %: 89 %
Platelets: 1033 10*3/uL (ref 150–400)
RBC: 3.02 MIL/uL — ABNORMAL LOW (ref 4.22–5.81)
RDW: 15.8 % — ABNORMAL HIGH (ref 11.5–15.5)
Smear Review: INCREASED
WBC: 19.3 10*3/uL — ABNORMAL HIGH (ref 4.0–10.5)
nRBC: 0.5 % — ABNORMAL HIGH (ref 0.0–0.2)

## 2020-07-21 LAB — COMPREHENSIVE METABOLIC PANEL
ALT: 15 U/L (ref 0–44)
AST: 42 U/L — ABNORMAL HIGH (ref 15–41)
Albumin: 2.6 g/dL — ABNORMAL LOW (ref 3.5–5.0)
Alkaline Phosphatase: 90 U/L (ref 38–126)
Anion gap: 29 — ABNORMAL HIGH (ref 5–15)
BUN: 82 mg/dL — ABNORMAL HIGH (ref 8–23)
CO2: 18 mmol/L — ABNORMAL LOW (ref 22–32)
Calcium: 8.3 mg/dL — ABNORMAL LOW (ref 8.9–10.3)
Chloride: 92 mmol/L — ABNORMAL LOW (ref 98–111)
Creatinine, Ser: 4.7 mg/dL — ABNORMAL HIGH (ref 0.61–1.24)
GFR, Estimated: 13 mL/min — ABNORMAL LOW (ref >60)
Glucose, Bld: 106 mg/dL — ABNORMAL HIGH (ref 70–99)
Potassium: 3.8 mmol/L (ref 3.5–5.1)
Sodium: 139 mmol/L (ref 135–145)
Total Bilirubin: 0.8 mg/dL (ref 0.3–1.2)
Total Protein: 6.6 g/dL (ref 6.5–8.1)

## 2020-07-21 LAB — SARS CORONAVIRUS 2 BY RT PCR (HOSPITAL ORDER, PERFORMED IN ~~LOC~~ HOSPITAL LAB): SARS Coronavirus 2: NEGATIVE

## 2020-07-21 LAB — PROTIME-INR
INR: 1.2 (ref 0.8–1.2)
Prothrombin Time: 15.1 seconds (ref 11.4–15.2)

## 2020-07-21 LAB — LACTIC ACID, PLASMA
Lactic Acid, Venous: >11 mmol/L (ref 0.5–1.9)
Lactic Acid, Venous: 4.7 mmol/L (ref 0.5–1.9)

## 2020-07-21 LAB — APTT: aPTT: 33 seconds (ref 24–36)

## 2020-07-21 MED ORDER — NOREPINEPHRINE 4 MG/250ML-% IV SOLN
2.0000 ug/min | INTRAVENOUS | Status: DC
  Administered 2020-07-21: 2 ug/min via INTRAVENOUS
  Administered 2020-07-22: 5 ug/min via INTRAVENOUS
  Filled 2020-07-21 (×2): qty 250

## 2020-07-21 MED ORDER — DOCUSATE SODIUM 100 MG PO CAPS: 100.0000 mg | ORAL_CAPSULE | Freq: Two times a day (BID) | ORAL | Status: DC | PRN

## 2020-07-21 MED ORDER — PANTOPRAZOLE SODIUM 40 MG IV SOLR
40.0000 mg | Freq: Every day | INTRAVENOUS | Status: DC
  Administered 2020-07-21: 40 mg via INTRAVENOUS
  Filled 2020-07-21: qty 40

## 2020-07-21 MED ORDER — ONDANSETRON HCL 4 MG/2ML IJ SOLN
4.0000 mg | Freq: Four times a day (QID) | INTRAMUSCULAR | Status: DC | PRN
  Administered 2020-07-22: 4 mg via INTRAVENOUS
  Filled 2020-07-21: qty 2

## 2020-07-21 MED ORDER — POLYETHYLENE GLYCOL 3350 17 G PO PACK: 17.0000 g | PACK | Freq: Every day | ORAL | Status: DC | PRN

## 2020-07-21 MED ORDER — LACTATED RINGERS IV SOLN: INTRAVENOUS | Status: DC

## 2020-07-21 MED ORDER — SODIUM CHLORIDE 0.9 % IV SOLN
2.0000 g | Freq: Once | INTRAVENOUS | Status: AC
  Administered 2020-07-21: 2 g via INTRAVENOUS
  Filled 2020-07-21: qty 2

## 2020-07-21 MED ORDER — SODIUM CHLORIDE 0.9 % IV SOLN: 250.0000 mL | INTRAVENOUS | Status: DC

## 2020-07-21 MED ORDER — SODIUM CHLORIDE 0.9 % IV SOLN
1000.0000 mL | Freq: Once | INTRAVENOUS | Status: AC
  Administered 2020-07-21: 1000 mL via INTRAVENOUS

## 2020-07-21 MED ORDER — METRONIDAZOLE IN NACL 5-0.79 MG/ML-% IV SOLN
500.0000 mg | Freq: Three times a day (TID) | INTRAVENOUS | Status: DC
  Administered 2020-07-22 (×2): 500 mg via INTRAVENOUS
  Filled 2020-07-21 (×2): qty 100

## 2020-07-21 MED ORDER — METRONIDAZOLE IN NACL 5-0.79 MG/ML-% IV SOLN
500.0000 mg | Freq: Once | INTRAVENOUS | Status: AC
  Administered 2020-07-21: 500 mg via INTRAVENOUS
  Filled 2020-07-21: qty 100

## 2020-07-21 MED ORDER — HEPARIN SODIUM (PORCINE) 5000 UNIT/ML IJ SOLN
5000.0000 [IU] | Freq: Three times a day (TID) | INTRAMUSCULAR | Status: DC
  Administered 2020-07-21 – 2020-07-22 (×3): 5000 [IU] via SUBCUTANEOUS
  Filled 2020-07-21 (×3): qty 1

## 2020-07-21 MED ORDER — SODIUM CHLORIDE 0.9 % IV SOLN: 10.0000 mL/h | Freq: Once | INTRAVENOUS | Status: DC

## 2020-07-21 MED ORDER — ONDANSETRON HCL 4 MG/2ML IJ SOLN
4.0000 mg | Freq: Once | INTRAMUSCULAR | Status: AC
  Administered 2020-07-21: 4 mg via INTRAVENOUS
  Filled 2020-07-21: qty 2

## 2020-07-21 NOTE — ED Notes (Addendum)
Pt presents to ED via EMS with c/o of low BP, weakness, and hypothermia. EMS states pt was seen here at the end of December and DX'ed with failure to thrive and pt left AMA. Pt presents hypotensive with a systolic BP in the 44'R, rectal temp of 94.7 and unreadable O2 sat due to pt being so cold at this time. Per EMS, sister found pt on the floor when she went to check. Pt is A&Ox4. Pt placed in reverse [place] for better perfusion. Pt presents with stool in his underwear, pt cleaned up and rectal temp obtained and male purewick placed for the ability to obtain a UA. Pt denies SI or HI. Pt states he lives alone at this time but cannot answer if he's safe at where he is living. IV placed, pt placed on cardiac monitor, EKG obtained. Pt also has weak pulses and cap refills in fingers and toes that is greater than 3 seconds and this RN is getting weak bilateral pedal pulses.

## 2020-07-21 NOTE — H&P (Signed)
NAME:  Wayne Mccarty, MRN:  [id number], DOB:  10-02-1950, LOS: 0 ADMISSION DATE:  07/26/2020, CONSULTATION DATE: 07/19/2020 REFERRING MD: Dr. Corky Downs, CHIEF COMPLAINT:   Weakness  Brief History:  70 year old male with septic shock and a moderate grade small bowel obstruction requiring vasopressor administration admitted to the ICU.  History of Present Illness:  70 year old male presented to the ED from home via EMS at the insistence of his sister with progressive fatigue and abdominal pain.  The patient was seen at [facility] ED on 06/14/2020 and found to likely have a colon mass with severe anemia.  The patient left AMA refusing admission and diagnostic work-up.  ED course: Initial vitals-T 94.5(rectal), RR 27, HR 92, BP 79/43, SPO2 90% on 3 L nasal cannula. Labs significant for-lactic > 11, hemoglobin 5.7, platelets 1033, WBC 19.3, serum CO2 18, BUN/creatinine 82/4.7 (increased from 22/1.09 in December 2021), AST 42, AG 29. Sepsis protocol initiated & blood transfusion ordered.  CT of chest/abdomen/pelvis concerning for aspiration pneumonia and moderate grade small bowel obstruction as well as a persistent mass in the patient's sigmoid colon.  Dr. Corky Downs spoke with Dr. Christian Mate in regards to the new small bowel obstruction, general surgery wanted the patient further resuscitated before intervention agreeing to NG tube placement with decompression.  PCCM consulted for admission due to vasopressor requirement. Past Medical History:  Kidney stones  Significant Hospital Events:  07/19/2020-admit to ICU with septic shock requiring vasopressor administration  Consults:  General surgery  Procedures:    Significant Diagnostic Tests:  08/05/2020 CT chest/abdomen/pelvis without contrast >> new moderate grade small bowel obstruction, stomach severely distended with air-fluid level, scattered airspace opacities at bilateral lung bases and right middle lobe with debris in the trachea concerning for  aspiration pneumonia.  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  Persistent mass involving the patient's sigmoid colon, which appears to have decreased in size from prior study.  Micro Data:  08/02/2020 blood cultures x 2 >> 07/26/2020 urine culture >> 07/30/2020 COVID-19 >> negative  Antimicrobials:  07/31/2020 cefepime >> 07/24/2020 metronidazole >>  Interim History / Subjective:  Patient alert and responsive to questions, able to follow commands.  When asked about what brought him into the hospital the patient stated, " I want to talk about that."  When prompted further he said, " I do not feel like talking, my nurse should know what you need to know". Spoke with the patient's sister who had arrived with him to the hospital.  She reported that it appeared to her that the patient had not been eating or drinking very much and said he was "going to the bathroom a lot".  Unclear if this was diarrhea.  His sister said she was very concerned about him and encouraged him to go to the hospital which he refused.  Eventually she called EMS to transport him for evaluation.  She reports that in general he does not follow-up with a PCP regularly and is not being monitored for any chronic conditions she knows about.  Labs/ Imaging personally reviewed Na+/ K+: 139/3.8 BUN/Cr.:  82/4.7 Hgb: 5.7  WBC/ TMAX: 19.3/hypothermic Lactic: > 11  CXR 08/01/2020: Right perihilar airspace opacity with left perihilar streaky opacities, concerning for multifocal pneumonia Objective   Blood pressure (!) 95/53, pulse 99, temperature (!) 96.9 F (36.1 C), temperature source Rectal, resp. rate (!) 22, SpO2 99 %.       No intake  or output data in the 24 hours ending 07/20/2020 2027 There were no vitals filed for this visit.  Examination: General: Adult male, critically ill, lying in bed, cachectic and frail-appearing, NAD HEENT: MM  pink/moist, anicteric, atraumatic, neck supple Neuro: *A&O x 3, able to follow commands, PERRL +3, MAE CV: s1s2 RRR, sinus tachycardia on monitor, no r/m/g Pulm: Regular, non labored on 4 L nasal cannula, breath sounds diminished throughout GI: Soft, distended, non tender, bs x 4 Skin: limited exam, no rashes/lesions noted Extremities: warm/dry, pulses + 2 R/P, no edema noted  Resolved Hospital Problem list    Assessment & Plan:  Sepsis without septic shock Due to suspected intra-abdominal infection vs CAP Lactic: > 11, CXR: Bilateral perihilar opacities concerning for pneumonia, CT: new moderate grade small bowel obstruction, stomach severely distended with air-fluid level, scattered airspace opacities at bilateral lung bases and right middle lobe with debris in the trachea concerning for aspiration pneumonia. Initial interventions/workup included: 2.5 L of NS & Cefepime/ Metronidazole - Supplemental oxygen as needed, to maintain SpO2 > 90% - f/u cultures, trend lactic/ PCT - Daily CBC - monitor WBC/ fever curve - IV antibiotics: cefepime & metronidazole - IVF hydration as needed: LR @ 100 mL/h - Continue levophed drip to maintain MAP< 65 - Strict I/O's: alert provider if UOP < 0.5 mL/kg/hr - Persistent hypotension consider stress dose steroids (hydrocortisone 50q6 or 100q8)  Moderate grade small bowel obstruction -NG tube to ILWS for decompression -General surgery consulted, appreciate input -Supportive care  Acute Kidney Injury  Multifocal in the setting of hypotension d/t septic shock, dehydration and severe anemia.  Anion gap metabolic acidosis PMHx: Kidney stones CT of the abdomen and pelvis also showed large partially obstructing stones in the right kidney. Baseline creatinine 1.09 (12/21), creatinine on admission 4.7 - Strict I/O's: alert provider if UOP < 0.5 mL/kg/hr - IV fluid resuscitation provided with sepsis protocol & continuous IVF hydration overnight - Daily  BMP, replace electrolytes PRN - Avoid nephrotoxic agents as able, ensure adequate renal perfusion  Hypoalbuminemia due to malnutrition/poor p.o. intake Patient has new small bowel obstruction -Supportive care -Dietary consult once patient is no longer n.p.o.  Best practice (evaluated daily)  Diet: npo Pain/Anxiety/Delirium protocol (if indicated): n/a VAP protocol (if indicated): n/a DVT prophylaxis: heparin SQ GI prophylaxis: protonix IV Glucose control: monitor Q 4 Mobility: bedrest, mobilize as tolerated Disposition:ICU  Goals of Care:  Last date of multidisciplinary goals of care discussion: 07/25/2020 Family and staff present: APP and patient, confirming previous conversation between ED MD and patient Summary of discussion: Discussed plan of care including vasopressor administration, NG tube decompression of stomach and potential need for surgical intervention, as well as blood administration for anemia Follow up goals of care discussion due: 07/22/2020 Code Status: Full  Labs   CBC: Recent Labs  Lab 07/24/2020 1659  WBC 19.3*  NEUTROABS 17.4*  HGB 5.7*  HCT 19.6*  MCV 64.9*  PLT 1,033*    Basic Metabolic Panel: Recent Labs  Lab 07/30/2020 1659  NA 139  K 3.8  CL 92*  CO2 18*  GLUCOSE 106*  BUN 82*  CREATININE 4.70*  CALCIUM 8.3*   GFR: CrCl cannot be calculated (Unknown ideal weight.). Recent Labs  Lab 07/20/2020 1659  WBC 19.3*  LATICACIDVEN >11.0*    Liver Function Tests: Recent Labs  Lab 07/20/2020 1659  AST 42*  ALT PENDING  ALKPHOS 90  BILITOT 0.8  PROT 6.6  ALBUMIN 2.6*   No results for input(s):  LIPASE, AMYLASE in the last 168 hours. No results for input(s): AMMONIA in the last 168 hours.  ABG No results found for: PHART, PCO2ART, PO2ART, HCO3, TCO2, ACIDBASEDEF, O2SAT   Coagulation Profile: Recent Labs  Lab 08/08/2020 1659  INR 1.2    Cardiac Enzymes: No results for input(s): CKTOTAL, CKMB, CKMBINDEX, TROPONINI in the last 168  hours.  HbA1C: No results found for: HGBA1C  CBG: No results for input(s): GLUCAP in the last 168 hours.  Review of Systems:   UTA-patient refuses to discuss symptoms that brought him into the hospital  Past Medical History:  He,  has a past medical history of Kidney stones.   Surgical History:   Past Surgical History:  Procedure Laterality Date  . KIDNEY STONE SURGERY  1994     Social History:   reports that he quit smoking about 2 years ago. His smoking use included cigarettes. He has a 11.25 pack-year smoking history. He has never used smokeless tobacco. He reports previous alcohol use of about 7.0 standard drinks of alcohol per week. He reports previous drug use.   Family History:  His family history includes Cancer in his mother and sister; Heart attack in his father.   Allergies Allergies  Allergen Reactions  . Sulfa Antibiotics Hives     Home Medications  Prior to Admission medications   Medication Sig Start Date End Date Taking? Authorizing Provider  famotidine (PEPCID) 20 MG tablet Take 20 mg by mouth 2 (two) times daily.   Yes [provider]     Critical care time: 45 minutes       Venetia Night, AGACNP-BC Acute Care Nurse Practitioner [facility]   [phone] / [phone] Please see Amion for pager details.

## 2020-07-21 NOTE — Progress Notes (Signed)
PHARMACY -  BRIEF ANTIBIOTIC NOTE   Pharmacy has received consult(s) for cefepime per pharmacy for intraabdominal infection from an ED provider.  The patient's profile has been reviewed for ht/wt/allergies/indication/available labs.    One time order(s) placed for cefepime  Further antibiotics/pharmacy consults should be ordered by admitting physician if indicated.                       Brendolyn Patty, PharmD Clinical Pharmacist  07/22/2020   7:11 PM

## 2020-07-21 NOTE — Progress Notes (Signed)
CODE SEPSIS - PHARMACY COMMUNICATION  **Broad Spectrum Antibiotics should be administered within 1 hour of Sepsis diagnosis**  Time Code Sepsis Called/Page Received: 1824  Antibiotics Ordered: 1823  Time of 1st antibiotic administration: 1841  Additional action taken by pharmacy: none  If necessary, Name of Provider/Nurse Contacted: [place] ,PharmD Clinical Pharmacist  Aug 11, 2020  6:46 PM

## 2020-07-21 NOTE — ED Notes (Signed)
Xray at bedside to confirm NG placement.

## 2020-07-21 NOTE — ED Notes (Signed)
Date and time results received: 08/03/2020 6:00 PM    Test: Plt Critical Value: 1033  Name of Provider Notified: Corky Downs

## 2020-07-21 NOTE — ED Notes (Signed)
Pt at CT

## 2020-07-21 NOTE — ED Notes (Signed)
2.5 liter NS bolus complete at this time

## 2020-07-21 NOTE — ED Notes (Signed)
Pressure bag applied to fluids at this time

## 2020-07-21 NOTE — Progress Notes (Signed)
Notified provider and bedside nurse of need to order fluid bolus.  Secure chat sent to MD and bedside RN asking for additional fluids to be ordered. MD responded pt is on his 2nd liter and had 531ml in EMS. elink will continue to monitor.

## 2020-07-21 NOTE — Progress Notes (Signed)
elink monitoring sepsis 

## 2020-07-21 NOTE — ED Provider Notes (Signed)
[facility] Emergency Department Provider Note   ____________________________________________    I have reviewed the triage vital signs and the nursing notes.   HISTORY  Chief Complaint Weakness  History limited, due to acuity   HPI Wayne Mccarty is a 70 y.o. male who presents with complaints of fatigue and weakness.  Review of medical records demonstrates the patient was seen in our emergency department on 31 December, found to have likely colon mass with severe anemia at that time.  Refused admission, left AMA.  Represents today with some confusion, fatigue, worsening abdominal pain.  Hypotensive for EMS  Past Medical History:  Diagnosis Date  . Kidney stones     Patient Active Problem List   Diagnosis Date Noted  . Sepsis ([place]) 07/26/2020  . Fatigue 11/10/2018  . Healthcare maintenance 11/10/2018  . 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 GERD 05/20/2010    Past Surgical History:  Procedure Laterality Date  . [place]    Prior to Admission medications   Medication Sig Start Date End Date Taking? Authorizing Provider  esomeprazole (NEXIUM) 20 MG packet Take 20 mg by mouth daily before breakfast.    [provider]     Allergies Sulfa antibiotics  Family History  Problem Relation Age of Onset  . Heart attack Father   . Cancer Mother        pancreatic  . Cancer Sister        colon    Social History Social History   Tobacco Use  . Smoking status: Former Smoker    Packs/day: 0.25    Years: 45.00    Pack years: 11.25    Types: Cigarettes    Quit date: 03/15/2018    Years since quitting: 2.3  . Smokeless tobacco: Never Used  Vaping Use  . Vaping Use: Never used  Substance Use Topics  . Alcohol use: Not Currently    Alcohol/week: 7.0 standard drinks    Types: 7 Cans of beer per week  . Drug use: Not Currently    Unable to obtain review of  Systems     ____________________________________________   PHYSICAL EXAM:  VITAL SIGNS: ED Triage Vitals  Enc Vitals Group     BP 08/05/2020 1631 (!) 79/43     Pulse Rate 08/11/2020 1631 92     Resp 08/11/2020 1631 (!) 27     Temp 07/18/2020 1631 (!) 94.5 F (34.7 C)     Temp Source 07/16/2020 1631 Rectal     SpO2 07/22/2020 1646 90 %     Weight --      Height --      Head Circumference --      Peak Flow --      Pain Score 08/06/2020 1915 5     Pain Loc --      Pain Edu? --      Excl. in [place]? --     Constitutional: Alert Eyes: Conjunctivae are normal.  Head: Atraumatic. Nose: No congestion/rhinnorhea. Mouth/Throat: Mucous membranes are dry  Cardiovascular: Normal rate, regular rhythm. Grossly normal heart sounds.  Good peripheral circulation. Respiratory: Normal respiratory effort.  No retractions. Lungs CTAB. Gastrointestinal: Distended, tender diffusely, no CVA tenderness  Musculoskeletal: No lower extremity tenderness nor edema.  Warm and well perfused Neurologic: No gross focal neurologic deficits are appreciated.  Skin:  Skin is warm, dry and intact. No rash noted.   ____________________________________________   LABS (all labs ordered are listed, but only  abnormal results are displayed)  Labs Reviewed  LACTIC ACID, PLASMA - Abnormal; Notable for the following components:      Result Value   Lactic Acid, Venous >11.0 (*)    All other components within normal limits  COMPREHENSIVE METABOLIC PANEL - Abnormal; Notable for the following components:   Chloride 92 (*)    CO2 18 (*)    Glucose, Bld 106 (*)    BUN 82 (*)    Creatinine, Ser 4.70 (*)    Calcium 8.3 (*)    Albumin 2.6 (*)    AST 42 (*)    GFR, Estimated 13 (*)    Anion gap 29 (*)    All other components within normal limits  CBC WITH DIFFERENTIAL/PLATELET - Abnormal; Notable for the following components:   WBC 19.3 (*)    RBC 3.02 (*)    Hemoglobin 5.7 (*)    HCT 19.6 (*)    MCV 64.9 (*)    MCH  18.9 (*)    MCHC 29.1 (*)    RDW 15.8 (*)    Platelets 1,033 (*)    nRBC 0.5 (*)    Neutro Abs 17.4 (*)    Lymphs Abs 0.5 (*)    Monocytes Absolute 1.1 (*)    Abs Immature Granulocytes 0.15 (*)    All other components within normal limits  SARS CORONAVIRUS 2 BY RT PCR (HOSPITAL ORDER, [place] [facility])  CULTURE, BLOOD (SINGLE)  URINE CULTURE  CULTURE, BLOOD (ROUTINE X 2)  CULTURE, BLOOD (ROUTINE X 2)  PROTIME-INR  APTT  LACTIC ACID, PLASMA  URINALYSIS, COMPLETE (UACMP) WITH MICROSCOPIC  CBC  BASIC METABOLIC PANEL  MAGNESIUM  PHOSPHORUS  TYPE AND SCREEN  PREPARE RBC (CROSSMATCH)   ____________________________________________  EKG  ED ECG REPORT I, Lavonia Drafts, the attending physician, personally viewed and interpreted this ECG.  Date: 2020/08/02  Rhythm: Likely junctional rhythm QRS Axis: normal Intervals: normal ST/T Wave abnormalities: Nonspecific change Narrative Interpretation: no evidence of acute ischemia  ____________________________________________  RADIOLOGY  Chest x-ray viewed by me, question multifocal pneumonia CT abdomen pelvis reviewed by me, consistent with SBO ____________________________________________   PROCEDURES  Procedure(s) performed: yes  Angiocath insertion Performed by: Lavonia Drafts  Consent: Verbal consent obtained. Risks and benefits: risks, benefits and alternatives were discussed Time out: Immediately prior to procedure a "time out" was called to verify the correct patient, procedure, equipment, support staff and site/side marked as required.  Preparation: Patient was prepped and draped in the usual sterile fashion.  Vein Location: left AC  Ultrasound Guided  Gauge: 18  Normal blood return and flush without difficulty Patient tolerance: Patient tolerated the procedure well with no immediate complications.     Procedures   Critical Care performed: yes  CRITICAL CARE Performed by:  Lavonia Drafts   Total critical care time: 40 minutes  Critical care time was exclusive of separately billable procedures and treating other patients.  Critical care was necessary to treat or prevent imminent or life-threatening deterioration.  Critical care was time spent personally by me on the following activities: development of treatment plan with patient and/or surrogate as well as nursing, discussions with consultants, evaluation of patient's response to treatment, examination of patient, obtaining history from patient or surrogate, ordering and performing treatments and interventions, ordering and review of laboratory studies, ordering and review of radiographic studies, pulse oximetry and re-evaluation of patient's condition.  ____________________________________________   INITIAL IMPRESSION / ASSESSMENT AND PLAN / ED COURSE  Pertinent  labs & imaging results that were available during my care of the patient were reviewed by me and considered in my medical decision making (see chart for details).  Patient appears critically ill upon arrival.  He is hypotensive and hypothermic.  He does appear somewhat confused.  Review of medical records as above, prior imaging suspicious for colon CA with partial small bowel obstruction at that time also with significant anemia of 6.5, refused admission at that time  We will give IV fluids now as he does appear quite dehydrated while we await labs further diagnostics  Blood pressure improved with IV fluids.  Lab work notable for lactic greater than 11, broad-spectrum IV antibiotics ordered.  Acute kidney injury with creatinine of 4.7.  Platelets of greater than thousand likely reactive  Hemoglobin of 5.7  After resuscitation patient is more alert and answering questions appropriately.  He is amenable to blood and has consented, he does want treatment.  2 units PRBCs ordered  Patient has received in total 2 L of normal saline here in the  emergency department and 500 cc with EMS   CT abdomen pelvis obtained which demonstrates moderate grade small bowel obstruction  Discussed with Dr. Christian Mate who agrees that patient needs further resuscitation before any sort of surgical intervention, does agree with NG tube at this time  Discussed with Dr. Keenan Bachelor of ICU who will admit the patient   Sepsis - Repeat Assessment  Performed at:    2000  Vitals     Blood pressure (!) 100/56, pulse 98, temperature (!) 96.1 F (35.6 C), temperature source Rectal, resp. rate (!) 21, SpO2 93 %.  Heart:     Regular rate and rhythm  Lungs:    Rales  Capillary Refill:   > 2 sec  Peripheral Pulse:   Radial pulse palpable  Skin:     Normal Color         ____________________________________________   FINAL CLINICAL IMPRESSION(S) / ED DIAGNOSES  Final diagnoses:  Abdominal distension  Malignant neoplasm of colon, unspecified part of colon (HCC)  Small bowel obstruction (HCC)  Acute kidney injury ([place])  Sepsis with acute renal failure and septic shock, due to unspecified organism, unspecified acute renal failure type [facility])        Note:  This document was prepared using Dragon voice recognition software and may include unintentional dictation errors.   Lavonia Drafts, MD 07/20/2020 2001

## 2020-07-21 NOTE — ED Notes (Signed)
Date and time results received: July 30, 2020 5:59 PM    Test: Lactic Critical Value: >11  Name of Provider Notified: Corky Downs

## 2020-07-22 ENCOUNTER — Other Ambulatory Visit: Payer: Self-pay

## 2020-07-22 ENCOUNTER — Encounter: Payer: Self-pay | Admitting: Pulmonary Disease

## 2020-07-22 DIAGNOSIS — R935 Abnormal findings on diagnostic imaging of other abdominal regions, including retroperitoneum: Secondary | ICD-10-CM | POA: Diagnosis not present

## 2020-07-22 DIAGNOSIS — R627 Adult failure to thrive: Secondary | ICD-10-CM

## 2020-07-22 DIAGNOSIS — G893 Neoplasm related pain (acute) (chronic): Secondary | ICD-10-CM | POA: Diagnosis not present

## 2020-07-22 DIAGNOSIS — K56609 Unspecified intestinal obstruction, unspecified as to partial versus complete obstruction: Secondary | ICD-10-CM | POA: Diagnosis not present

## 2020-07-22 DIAGNOSIS — A419 Sepsis, unspecified organism: Secondary | ICD-10-CM | POA: Diagnosis not present

## 2020-07-22 DIAGNOSIS — R140 Abdominal distension (gaseous): Secondary | ICD-10-CM | POA: Diagnosis not present

## 2020-07-22 DIAGNOSIS — R1900 Intra-abdominal and pelvic swelling, mass and lump, unspecified site: Secondary | ICD-10-CM | POA: Diagnosis not present

## 2020-07-22 DIAGNOSIS — C189 Malignant neoplasm of colon, unspecified: Secondary | ICD-10-CM

## 2020-07-22 LAB — MRSA PCR SCREENING: MRSA by PCR: NEGATIVE

## 2020-07-22 LAB — TYPE AND SCREEN
ABO/RH(D): O NEG
Antibody Screen: NEGATIVE
Unit division: 0
Unit division: 0

## 2020-07-22 LAB — BPAM RBC
Blood Product Expiration Date: 202202102359
Blood Product Expiration Date: 202202112359
ISSUE DATE / TIME: 202202062001
Unit Type and Rh: 9500
Unit Type and Rh: 9500

## 2020-07-22 LAB — URINALYSIS, COMPLETE (UACMP) WITH MICROSCOPIC
Bacteria, UA: NONE SEEN
Bilirubin Urine: NEGATIVE
Glucose, UA: NEGATIVE mg/dL
Ketones, ur: 5 mg/dL — AB
Nitrite: NEGATIVE
Protein, ur: 100 mg/dL — AB
Specific Gravity, Urine: 1.017 (ref 1.005–1.030)
Squamous Epithelial / HPF: NONE SEEN (ref 0–5)
pH: 5 (ref 5.0–8.0)

## 2020-07-22 LAB — CBG MONITORING, ED
Glucose-Capillary: 109 mg/dL — ABNORMAL HIGH (ref 70–99)
Glucose-Capillary: 110 mg/dL — ABNORMAL HIGH (ref 70–99)
Glucose-Capillary: 122 mg/dL — ABNORMAL HIGH (ref 70–99)
Glucose-Capillary: 73 mg/dL (ref 70–99)

## 2020-07-22 LAB — PREPARE RBC (CROSSMATCH)

## 2020-07-22 LAB — LACTIC ACID, PLASMA: Lactic Acid, Venous: 1.9 mmol/L (ref 0.5–1.9)

## 2020-07-22 LAB — HEMOGLOBIN AND HEMATOCRIT, BLOOD
HCT: 26.8 % — ABNORMAL LOW (ref 39.0–52.0)
Hemoglobin: 8.2 g/dL — ABNORMAL LOW (ref 13.0–17.0)

## 2020-07-22 MED ORDER — DEXTROSE IN LACTATED RINGERS 5 % IV SOLN: INTRAVENOUS | Status: DC

## 2020-07-22 MED ORDER — SODIUM CHLORIDE 0.9 % IV SOLN: 2.0000 g | INTRAVENOUS | Status: DC

## 2020-07-22 MED ORDER — PROPOFOL 1000 MG/100ML IV EMUL: 5.0000 ug/kg/min | INTRAVENOUS | Status: DC

## 2020-07-22 MED ORDER — MORPHINE SULFATE (PF) 2 MG/ML IV SOLN
1.0000 mg | INTRAVENOUS | Status: DC | PRN
  Administered 2020-07-22 – 2020-07-23 (×2): 1 mg via INTRAVENOUS
  Filled 2020-07-22 (×2): qty 1

## 2020-07-22 MED ORDER — FENTANYL CITRATE (PF) 100 MCG/2ML IJ SOLN
12.5000 ug | INTRAMUSCULAR | Status: DC | PRN
  Administered 2020-07-22: 12.5 ug via INTRAVENOUS
  Filled 2020-07-22: qty 2

## 2020-07-22 NOTE — ED Notes (Signed)
Called lab at this time for assistance with difficult blood draw, states the next phlebotomist is coming in at 6 and they will send them at that time

## 2020-07-22 NOTE — Consult Note (Addendum)
[facility] SURGICAL CONSULTATION NOTE (initial) - cpt: 84696   HISTORY OF PRESENT ILLNESS (HPI):  70 y.o. male presented to [facility] ED yesterday for evaluation of failure to thrive and weakness. A significant portion of the history is obtained through chart review. Appears patient was seen at the end of 2021 for complaints of abdominal pain and weight loss over a course of the last year. At that time, he was having generalized abdominal soreness and intermittently bloody diarrhea. Work up in the ED revealed he was anemic but patient refused any blood transfusions. Additionally, he had CT Abdomen/pelvis which was concerning for large necrotic mass in the pelvis, most likely malignant colon cancer. Despite EDP attempts at admission, the patient left AMA.   He presents back tot he ED yesterday via EMS. Again, history is relatively limited. Apparently called EMS secondary to weakness, confusion, and low blood pressure. Work up in the ED was concerning for significant lactic acidosis >11.0 (now 1.9), severe AKI with sCr - 4.70, leukocytosis to 19.3K, anemia to 5.7, thrombocytosis with platelets 1033. CT Abdomen/pelvis was again concerning for large, likely malignant pelvis mass, and significant small bowel dilation concerning for obstruction. Patient this time agreeable with admission, and he was admitted to [facility].   This morning, he is sleepy but arouse appropriate. His participation remains limited. He is able to tell me that he has never had surgery before. He has not had any significant bowel function "in a few days."   Surgery is consulted by emergency medicine physician Dr. Lavonia Drafts, MD in this context for evaluation and management of SBO in setting of likely advance colon cancer.   PAST MEDICAL HISTORY (PMH):  Past Medical History:  Diagnosis Date  . Kidney stones      PAST SURGICAL HISTORY [facility]):  Past Surgical History:  Procedure Laterality Date  . KIDNEY STONE SURGERY   1994     MEDICATIONS:  Prior to Admission medications   Medication Sig Start Date End Date Taking? Authorizing Provider  famotidine (PEPCID) 20 MG tablet Take 20 mg by mouth 2 (two) times daily.   Yes [provider]     ALLERGIES:  Allergies  Allergen Reactions  . Sulfa Antibiotics Hives     SOCIAL HISTORY:  Social History   Socioeconomic History  . Marital status: Single    Spouse name: Not on file  . Number of children: Not on file  . Years of education: Not on file  . Highest education level: Not on file  Occupational History  . Not on file  Tobacco Use  . Smoking status: Former Smoker    Packs/day: 0.25    Years: 45.00    Pack years: 11.25    Types: Cigarettes    Quit date: 03/15/2018    Years since quitting: 2.3  . Smokeless tobacco: Never Used  Vaping Use  . Vaping Use: Never used  Substance and Sexual Activity  . Alcohol use: Not Currently    Alcohol/week: 7.0 standard drinks    Types: 7 Cans of beer per week  . Drug use: Not Currently  . Sexual activity: Not Currently  Other Topics Concern  . Not on file  Social History Narrative  . Not on file   Social Determinants of Health   Financial Resource Strain: Not on file  Food Insecurity: Not on file  Transportation Needs: Not on file  Physical Activity: Not on file  Stress: Not on file  Social Connections: Not on file  Intimate  Partner Violence: Not on file     FAMILY HISTORY:  Family History  Problem Relation Age of Onset  . Heart attack Father   . Cancer Mother        pancreatic  . Cancer Sister        colon      REVIEW OF SYSTEMS:  Review of Systems  Unable to perform ROS: Acuity of condition  Constitutional: Positive for weight loss.  Gastrointestinal: Positive for abdominal pain, diarrhea and nausea.    VITAL SIGNS:  Temp:  [94.5 F (34.7 C)-98.3 F (36.8 C)] 97.9 F (36.6 C) (02/07 0430) Pulse Rate:  [70-128] 86 (02/07 0700) Resp:  [20-29] 26 (02/07 0700) BP:  (69-113)/(43-85) 96/55 (02/07 0700) SpO2:  [82 %-99 %] 94 % (02/07 0700) Weight:  [51 kg] 51 kg (02/07 0156)       Weight: 51 kg     INTAKE/OUTPUT:  02/06 0701 - 02/07 0700 In: 280.1 [I.V.:280.1] Out: 600   PHYSICAL EXAM:  Physical Exam Constitutional:      General: He is not in acute distress.    Appearance: He is cachectic.     Comments: Patient resting in bed, arouses but does not participate much. He is very cachetic with significant muscle wasting  HENT:     Head: Normocephalic and atraumatic.  Eyes:     Conjunctiva/sclera: Conjunctivae normal.  Cardiovascular:     Rate and Rhythm: Normal rate.     Pulses: Normal pulses.  Pulmonary:     Effort: No respiratory distress.     Breath sounds: Normal breath sounds.     Comments: On [place] Abdominal:     General: Abdomen is flat. There is distension.     Tenderness: There is generalized abdominal tenderness. There is no guarding or rebound.     Comments: Again he is very thin, abdomen is distended and tympanic, he appears somewhat diffusely uncomfortable, no rebound/guarding, no evidence of peritonitis   Genitourinary:    Comments: Deferred Musculoskeletal:     Right lower leg: No edema.     Left lower leg: No edema.  Skin:    General: Skin is warm and dry.  Neurological:     Mental Status: He is alert.     Comments: Difficult to reliably assess  Psychiatric:     Comments: Difficult to reliably assess      Labs:  CBC Latest Ref Rng & Units 07/22/2020 22-Jul-2020 06/14/2020  WBC 4.0 - 10.5 K/uL - 19.3(H) 10.1  Hemoglobin 13.0 - 17.0 g/dL 8.2(L) 5.7(L) 6.5(L)  Hematocrit 39.0 - 52.0 % 26.8(L) 19.6(L) 21.1(L)  Platelets 150 - 400 K/uL - 1,033(HH) 621(H)   CMP Latest Ref Rng & Units 07-22-20 06/14/2020 10/17/2019  Glucose 70 - 99 mg/dL 106(H) 96 90  BUN 8 - 23 mg/dL 82(H) 22 24  Creatinine 0.61 - 1.24 mg/dL 4.70(H) 1.09 0.96  Sodium 135 - 145 mmol/L 139 134(L) 139  Potassium 3.5 - 5.1 mmol/L 3.8 3.6 4.3  Chloride 98 - 111  mmol/L 92(L) 100 103  CO2 22 - 32 mmol/L 18(L) 23 23  Calcium 8.9 - 10.3 mg/dL 8.3(L) 9.4 9.1  Total Protein 6.5 - 8.1 g/dL 6.6 7.5 7.0  Total Bilirubin 0.3 - 1.2 mg/dL 0.8 0.5 0.3  Alkaline Phos 38 - 126 U/L 90 85 101  AST 15 - 41 U/L 42(H) 15 21  ALT 0 - 44 U/L 15 11 22      Imaging studies:   CTA Chest/Abdomen/Pelvis (06/15/2020) personally reviewed  for comparison, large pelvic mass, likely malignant colon cancer, and radiologist report reviewed below:  IMPRESSION: VASCULAR  No active extravasation identified. No vascular normally identified.  NON-VASCULAR  Large, lobulated, partially necrotic mass compatible with a primary colonic malignancy arising from the sigmoid colon resulting in a partial large bowel obstruction. The mass abuts the sacrum posteriorly and likely extends into the sacral foramina, possibly invades the posterior wall the bladder anteriorly, extends superiorly to abut several loops of small bowel with probable invasion of the a single loop of a distal small bowel. No evidence of regional nodal or distant metastatic disease within the abdomen and pelvis.  Extensive right nephrolithiasis. Superimposed mild right hydronephrosis likely related to obstruction of the distal right ureter secondary to the a mass.  Aortic Atherosclerosis (ICD10-I70.0).   CT Chest/Abdomen/Pelvis (07/22/2020) personally reviewed again with large pelvic mass but now with significant small bowel distension concerning for obstruction, and radiologist report reviewed:  IMPRESSION: 1. New moderate grade small bowel obstruction with a transition point in the patient's high midline pelvis. This transition point is adjacent to the known pelvic mass and may be secondary to direct invasion of the small bowel. 2. The stomach is severely distended with an air-fluid level. The patient would likely benefit from NG tube decompression. 3. Scattered airspace opacities at the lung bases  bilaterally and the right middle lobe. There is debris within the trachea. Findings are concerning for aspiration pneumonia. 4. 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 5. Persistent mass involving the patient's sigmoid colon which appears to have decreased in size from the prior study. This mass appears to directly abut the urinary bladder and multiple loops of small bowel. 6. Anemia.   Assessment/Plan: (ICD-10's: K44.609) 71 y.o. male presenting with failure to thrive found to have now likely small bowel obstruction secondary to either direst involvement with large pelvis mass vs peritoneal carcinomatosis from likely significantly advanced colon cancer.    - This is a very unfortunate situation without any good solutions. I do believe he likely has a very advanced malignant process (likely colon cancer) now with either direct involvement with the small bowel vs peritoneal carcinomatosis resulting in obstruction. He does not have any previous intra-abdominal surgery as well which raises my suspicion for advancement of his disease. At this point, given his extremely frail state and likely advanced disease, I do not think there are any good surgical options for this gentlemen nor would he be able to tolerate these. I attempted to spend significant time explaining this to the patient. He was very limited in his participation, but he did ask a few insightful questions. Going forward, I do think we need to involve palliative care in this patient to discuss end of life care/goals. He seemed agreeable with this but just "wanted to sleep."  In summary....   - Recommend involving Palliative Care  - No surgical intervention for reasons above  - Can hold off on NGT for now. Can replace if patient desires or becomes symptomatic   - Monitor abdominal examination; on-going bowel function    - Further  management per primary team; we will be available   All of the above findings and recommendations were discussed with the patient, and all of patient's questions were answered to his expressed satisfaction.  Thank you for the opportunity to participate in this patient's care.   -- Edison Simon, PA-C [facility] 07/22/2020,  8:02 AM [phone] M-F: 7am - 4pm

## 2020-07-22 NOTE — ED Notes (Signed)
Transportation at bedside.  

## 2020-07-22 NOTE — Progress Notes (Signed)
[facility]) Hospital Liaison RN note:  Received request from Altha Harm, NP for family interest in [facility]. Chart was reviewed and eligibility was approved. [facility] Liaison will reach out to family to confirm interest and explain services.   Unfortunately, [facility] not have a room to offer at this time. [facility] Liaison will continue to monitor for room availability.  Please call with any hospice related questions or concerns.  Thank you for the opportunity to participate in this patient's care.  Zandra Abts, RN [facility] Liaison [phone]

## 2020-07-22 NOTE — ED Notes (Signed)
Pt continuing to refuse AM labs.

## 2020-07-22 NOTE — ED Notes (Signed)
Attempted to draw blood work , pt refused

## 2020-07-22 NOTE — ED Notes (Signed)
NP from [facility] at pt bedside . I advised that pt has refused blood draws , Pt AOx4

## 2020-07-22 NOTE — ED Notes (Signed)
Pt removed NG tube, refuses another to be placed. Provider notified.

## 2020-07-22 NOTE — ED Notes (Signed)
Pt requesting to speak with sister / called sister Bethena Roys

## 2020-07-22 NOTE — ED Notes (Signed)
Dr. Patsey Berthold at bedside to assess patient, confirmed pt DNR. Pt requesting ice chips, given as approved by MD.

## 2020-07-22 NOTE — Consult Note (Signed)
[place]  [phone] [phone]   Name: Wayne Mccarty Date: 07/22/2020 MRN: [id number]  DOB: 16-Apr-1951  Patient Care Team: Lorrene Reid, PA-C as PCP - General (Physician Assistant)    REASON FOR CONSULTATION: Wayne Mccarty is a 70 y.o. male with multiple medical problems including history of kidney stones, who was seen by GI in July 2021 for diarrhea and progressive weight loss and was recommended to pursue endoscopy and colonoscopy due to concern for malignancy.  Patient was not interested in pursuing work-up at that time.  He was seen in the ED on 06/14/2020 for diarrhea, abdominal pain, and weight loss.  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  The mass was found to abut the sacrum extending into the sacral foramina, invading the posterior bladder wall, and possible invasion of the small bowel.  However, he left AMA after refusing further work-up or treatment.  Patient represents to the ED on 2020-07-27 with progressive fatigue and abdominal pain.  CT of the abdomen and pelvis was consistent with new small bowel obstruction secondary to pelvic mass and possible direct invasion of the small bowel.  Patient was also found to have probable aspiration pneumonia.  He was started on treatment for clinical sepsis including antibiotics and IV pressors.  Patient has consistently refused repeat labs and medications.  He was referred to palliative care to help address goals.  SOCIAL HISTORY:     reports that he quit smoking about 2 years ago. His smoking use included cigarettes. He has a 11.25 pack-year smoking history. He has never used smokeless tobacco. He reports previous alcohol use of about 7.0 standard drinks of alcohol per week. He reports previous drug use.  Patient lives at home alone.  He was unmarried.  He  has no children.  He has a sister who lives in [place] but is involved in his care.  ADVANCE DIRECTIVES:  Not on file  CODE STATUS: DNR  PAST MEDICAL HISTORY: Past Medical History:  Diagnosis Date  . Kidney stones     PAST SURGICAL HISTORY:  Past Surgical History:  Procedure Laterality Date  . KIDNEY STONE SURGERY  1994    HEMATOLOGY/ONCOLOGY HISTORY:  Oncology History   No history exists.    ALLERGIES:  is allergic to sulfa antibiotics.  MEDICATIONS:  Current Facility-Administered Medications  Medication Dose Route Frequency Provider Last Rate Last Admin  . 0.9 %  sodium chloride infusion  10 mL/hr Intravenous Once Lavonia Drafts, MD   Held at 07-27-20 2118  . 0.9 %  sodium chloride infusion  250 mL Intravenous Continuous Rust-Chester, Huel Cote, NP   Held at 2020-07-27 2117  . ceFEPIme (MAXIPIME) 2 g in sodium chloride 0.9 % 100 mL IVPB  2 g Intravenous Q24H Aleskerov, Fuad, MD      . dextrose 5 % in lactated ringers infusion   Intravenous Continuous Rust-Chester, Britton L, NP 75 mL/hr at 07/22/20 0720 Rate Verify at 07/22/20 0720  . docusate sodium (COLACE) capsule 100 mg  100 mg Oral BID PRN Rust-Chester, Britton L, NP      . fentaNYL (SUBLIMAZE) injection 12.5-25 mcg  12.5-25 mcg Intravenous Q4H PRN Tyler Pita, MD   12.5 mcg at 07/22/20 0834  . heparin injection 5,000 Units  5,000 Units Subcutaneous Q8H Rust-Chester, Britton L, NP   5,000 Units at 07/22/20 1335  .  metroNIDAZOLE (FLAGYL) IVPB 500 mg  500 mg Intravenous Q8H Rust-Chester, Britton L, NP   Stopped at 07/22/20 1148  . norepinephrine (LEVOPHED) 4mg  in 264mL premix infusion  2-10 mcg/min Intravenous Titrated Rust-Chester, Britton L, NP 18.75 mL/hr at 07/22/20 0934 5 mcg/min at 07/22/20 0934  . ondansetron (ZOFRAN) injection 4 mg  4 mg Intravenous Q6H PRN Rust-Chester, Britton L, NP   4 mg at 07/22/20 0834  . pantoprazole (PROTONIX) injection 40 mg  40 mg Intravenous QHS Rust-Chester, Britton L, NP   40 mg  at 07/19/2020 2217  . polyethylene glycol (MIRALAX / GLYCOLAX) packet 17 g  17 g Oral Daily PRN Rust-Chester, Huel Cote, NP       Current Outpatient Medications  Medication Sig Dispense Refill  . famotidine (PEPCID) 20 MG tablet Take 20 mg by mouth 2 (two) times daily.      VITAL SIGNS: BP (!) 93/51   Pulse (!) 103   Temp 97.7 F (36.5 C) (Oral)   Resp (!) 33   Wt 112 lb 7 oz (51 kg)   SpO2 100%   BMI 15.25 kg/m  Filed Weights   07/22/20 0156  Weight: 112 lb 7 oz (51 kg)    Estimated body mass index is 15.25 kg/m as calculated from the following:   Height as of 06/14/20: 6' (1.829 m).   Weight as of this encounter: 112 lb 7 oz (51 kg).  LABS: CBC:    Component Value Date/Time   WBC 19.3 (H) 07/20/2020 1659   HGB 8.2 (L) 07/22/2020 0021   HGB 11.9 (L) 10/17/2019 1204   HCT 26.8 (L) 07/22/2020 0021   HCT 34.3 (L) 10/17/2019 1204   PLT 1,033 (HH) 07/22/2020 1659   PLT 392 10/17/2019 1204   MCV 64.9 (L) 07/25/2020 1659   MCV 83 10/17/2019 1204   NEUTROABS 17.4 (H) 07/17/2020 1659   LYMPHSABS 0.5 (L) 07/29/2020 1659   MONOABS 1.1 (H) 07/22/2020 1659   EOSABS 0.0 07/18/2020 1659   BASOSABS 0.1 07/19/2020 1659   Comprehensive Metabolic Panel:    Component Value Date/Time   NA 139 08/04/2020 1659   NA 139 10/17/2019 1204   K 3.8 07/16/2020 1659   CL 92 (L) 08/06/2020 1659   CO2 18 (L) 08/03/2020 1659   BUN 82 (H) 07/29/2020 1659   BUN 24 10/17/2019 1204   CREATININE 4.70 (H) 07/29/2020 1659   GLUCOSE 106 (H) 08/11/2020 1659   CALCIUM 8.3 (L) 07/25/2020 1659   AST 42 (H) 08/01/2020 1659   ALT 15 07/30/2020 1659   ALKPHOS 90 07/16/2020 1659   BILITOT 0.8 07/17/2020 1659   BILITOT 0.3 10/17/2019 1204   PROT 6.6 08/09/2020 1659   PROT 7.0 10/17/2019 1204   ALBUMIN 2.6 (L) 07/22/2020 1659   ALBUMIN 4.2 10/17/2019 1204    RADIOGRAPHIC STUDIES: DG Chest Port 1 View  Result Date: 07/16/2020 CLINICAL DATA:  Questionable sepsis, evaluate for abnormality EXAM:  PORTABLE CHEST 1 VIEW COMPARISON:  None. FINDINGS: The heart size and mediastinal contours are within normal limits. Right perihilar airspace opacity with left perihilar streaky opacities. The visualized skeletal structures are unremarkable. IMPRESSION: Right perihilar airspace opacity with left perihilar streaky opacities, findings concerning for multifocal pneumonia. Electronically Signed   By: Dahlia Bailiff MD   On: 08/07/2020 17:25   DG Abd Portable 1V  Result Date: 07/20/2020 CLINICAL DATA:  Nasogastric tube placement. EXAM: PORTABLE ABDOMEN - 1 VIEW COMPARISON:  Same day CT chest abdomen pelvis. FINDINGS: Nasogastric tube with  tip and side port overlying the stomach. Persistent small bowel obstruction. Bibasilar airspace opacities. IMPRESSION: Nasogastric tube tip and side port overlying the stomach. Electronically Signed   By: Dahlia Bailiff MD   On: 07/16/2020 21:10   CT CHEST ABDOMEN PELVIS WO CONTRAST  Result Date: 07/20/2020 CLINICAL DATA:  Abdominal distension.  History of failure to thrive. EXAM: CT CHEST, ABDOMEN AND PELVIS WITHOUT CONTRAST TECHNIQUE: Multidetector CT imaging of the chest, abdomen and pelvis was performed following the standard protocol without IV contrast. COMPARISON:  June 15, 2020 FINDINGS: CT CHEST FINDINGS Cardiovascular: The heart size is unremarkable. The intracardiac blood pool is hypodense relative to the adjacent myocardium consistent with anemia. There are coronary artery calcifications. There are mild atherosclerotic changes of the thoracic aorta. The intracardiac blood pool is hypodense relative to the adjacent myocardium consistent with anemia. Mediastinum/Nodes: -- No mediastinal lymphadenopathy. -- No hilar lymphadenopathy. -- No axillary lymphadenopathy. -- No supraclavicular lymphadenopathy. -- Normal thyroid gland where visualized. -there is mild diffuse wall thickening of the esophagus. The esophagus is fluid-filled to the level of the upper thorax.  Lungs/Pleura: There is scattered airspace opacities at the lung bases bilaterally and the right middle lobe. There is debris within the trachea. Findings are concerning for aspiration pneumonia. There is no pneumothorax. No significant pleural effusion. Musculoskeletal: No chest wall abnormality. No bony spinal canal stenosis. CT ABDOMEN PELVIS FINDINGS Hepatobiliary: The liver is normal. Normal gallbladder.There is no biliary ductal dilation. Pancreas: Normal contours without ductal dilatation. No peripancreatic fluid collection. Spleen: Unremarkable. Adrenals/Urinary Tract: --Adrenal glands: Unremarkable. 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 There are no definite distal ureteral stones. --Left kidney/ureter: No hydronephrosis or radiopaque kidney stones. --Urinary bladder: The bladder is not well evaluated secondary to underdistention and lack of IV contrast. The patient's known pelvic mass abuts the bladder wall. There is loss of a fat plane between the bladder wall and this mass. Stomach/Bowel: --Stomach/Duodenum: The stomach is severely distended with an air-fluid level. 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 There is a new moderate grade small bowel obstruction with a transition point in the patient's high midline pelvis. This transition point is adjacent to the known pelvic mass and may be secondary to direct invasion of the small bowel. --Colon: Again noted is a large mass centered at the level of the patient's sigmoid colon. This mass appears no chronic but overall appears to have decreased in size since the prior study. There is a large amount of stool throughout the colon. --Appendix: Normal. Vascular/Lymphatic: Atherosclerotic calcification is present within the non-aneurysmal abdominal aorta, without hemodynamically significant  stenosis. --No retroperitoneal lymphadenopathy. --No mesenteric lymphadenopathy. --No pelvic or inguinal lymphadenopathy. Reproductive: Unremarkable Other: No ascites or free air. The abdominal wall is normal. Musculoskeletal. No acute displaced fractures. IMPRESSION: 1. New moderate grade small bowel obstruction with a transition point in the patient's high midline pelvis. This transition point is adjacent to the known pelvic mass and may be secondary to direct invasion of the small bowel. 2. The stomach is severely distended with an air-fluid level. The patient would likely benefit from NG tube decompression. 3. Scattered airspace opacities at the lung bases bilaterally and the right middle lobe. There is debris within the trachea. Findings are concerning for aspiration pneumonia. 4. 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 5. Persistent mass involving the patient's sigmoid colon which appears to have decreased in size from the prior study. This mass appears to directly abut the urinary bladder and multiple loops of small bowel. 6. Anemia. Aortic Atherosclerosis (ICD10-I70.0). Electronically Signed   By: Constance Holster M.D.   On: 07/31/2020 19:14    PERFORMANCE STATUS (ECOG) : 4 - Bedbound  Review of Systems Unless otherwise noted, a complete review of systems is negative.  Physical Exam General: Ill-appearing, frail, cachectic Cardiovascular: regular rate and rhythm Pulmonary: Unlabored Abdomen: Distended, tender GU: no suprapubic tenderness Extremities: no edema, no joint deformities Skin: no rashes Neurological: Weakness but otherwise nonfocal  IMPRESSION: Patient seen in the ED.  I was able to discuss goals with patient.  His sister participated in the conversation via phone.   Patient was able to verbalize to me an understanding that he has an intestinal obstruction.  He recalls  speaking with the surgeon and tells me that he "does not want surgery."  Patient is not felt to be a surgical candidate.  He is also not felt to have any viable options for cancer treatment in the setting of presumed malignancy.  Patient is critically ill and appears likely to be approaching end-of-life. Patient tells me "I am not going to be around much longer."   Patient has had periods of discomfort and pain.  He has consistently refused lab draws and medications today per RN.  Patient pulled his nasogastric tube earlier today.   We discussed the option of comfort care in detail including discontinuing any noncomfort medications such as antibiotics and pressors.  Both patient and sister verbalized agreement with comfort care.    We discussed the option of hospice involvement.  Patient stated that he wanted to think about hospice but his sister would prefer patient transfer to the [facility] when a bed is available. Patient was in agreement with being placed on the waiting list for hospice IPU. However, it is unclear if patient will survive or be stable enough to transfer when/if a bed becomes available.   Patient's only real request was that he be allowed to drink Sprite.  He does understand that this could potentially exacerbate nausea.  However, I would recommend permitting sips for patient's comfort.  PLAN: -Recommend comfort care -DNR/DNI -Will consult hospice liaison to place patient on [facility] waiting list  Case and plan discussed with Drs. Grayland Ormond and Patsey Berthold   Time Total: 60 minutes  Visit consisted of counseling and education dealing with the complex and emotionally intense issues of symptom management and palliative care in the setting of serious and potentially life-threatening illness.Greater than 50%  of this time was spent counseling and coordinating care related to the above assessment and plan.  Signed by: Altha Harm, PhD, NP-C

## 2020-07-22 NOTE — Progress Notes (Addendum)
NAME:  Wayne Mccarty, MRN:  [id number], DOB:  [birth year], LOS: 1 ADMISSION DATE:  08/05/2020, INITIAL CONSULTATION DATE: 07/24/2020 REFERRING MD: Dr. Corky Downs, CHIEF COMPLAINT:   Weakness  Brief History:  70 year old male with septic shock and a moderate grade small bowel obstruction requiring vasopressor administration admitted to the ICU.  History of Present Illness:  70 year old male presented to the ED from home via EMS at the insistence of his sister with progressive fatigue and abdominal pain.  The patient was seen at [facility] ED on 06/14/2020 and found to likely have a colon mass with severe anemia.  The patient left AMA refusing admission and diagnostic work-up.  ED course: Initial vitals-T 94.5(rectal), RR 27, HR 92, BP 79/43, SPO2 90% on 3 L nasal cannula. Labs significant for-lactic > 11, hemoglobin 5.7, platelets 1033, WBC 19.3, serum CO2 18, BUN/creatinine 82/4.7 (increased from 22/1.09 in December 2021), AST 42, AG 29. Sepsis protocol initiated & blood transfusion ordered.  CT of chest/abdomen/pelvis concerning for aspiration pneumonia and moderate grade small bowel obstruction as well as a persistent mass in the patient's sigmoid colon.  Dr. Corky Downs spoke with Dr. Christian Mate in regards to the new small bowel obstruction, general surgery wanted the patient further resuscitated before intervention agreeing to NG tube placement with decompression.  PCCM consulted for admission due to vasopressor requirement.  Past Medical History:  Kidney stones  Significant Hospital Events:  07/17/2020-admit to ICU with septic shock requiring vasopressor administration 07/22/2020-remains in the ED due to no ICU beds, still pressor dependent.  Refusing therapies such as NG decompression, refusing blood draws.  States just wants to be "comfortable".  Wants to take p.o.'s.  Consults:  General surgery  Procedures:    Significant Diagnostic Tests:  07/26/2020 CT chest/abdomen/pelvis without contrast >> new  moderate grade small bowel obstruction, stomach severely distended with air-fluid level, scattered airspace opacities at bilateral lung bases and right middle lobe with debris in the trachea concerning for aspiration pneumonia.  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  Persistent mass involving the patient's sigmoid colon, which appears to have decreased in size from prior study.  Micro Data:  08/06/2020 blood cultures x 2 >> 08/12/2020 urine culture >> 07/28/2020 COVID-19 >> negative  Antimicrobials:  07/28/2020 cefepime >> 07/31/2020 metronidazole >>  Interim History / Subjective:  Patient alert and responsive to questions, able to follow commands.  Refusing therapies, has pulled NG tube out does not want reinserted.  Wants pain control.  Wants to be comfortable.  Labs/ Imaging personally reviewed and are as noted.  CXR 08/10/2020: Right perihilar airspace opacity with left perihilar streaky opacities, concerning for multifocal pneumonia CT chest 07/16/2020 showed esophagus filled with food material aspiration highly likely. Objective   Blood pressure (!) 100/59, pulse 86, temperature 97.9 F (36.6 C), temperature source Oral, resp. rate (!) 27, weight 51 kg, SpO2 96 %.        Intake/Output Summary (Last 24 hours) at 07/22/2020 0859 Last data filed at 07/22/2020 0720 Gross per 24 hour  Intake 280.07 ml  Output 800 ml  Net -519.93 ml   Filed Weights   07/22/20 0156  Weight: 51 kg    Examination: General: Adult male, clear ill-appearing, cachectic, frail, lying in bed, no respiratory distress  HEENT: MM pink/moist, anicteric, atraumatic, neck supple.  Bitemporal wasting Neuro: *A&O x 3, able to follow commands, PERRL +3, MAE CV: s1s2 RRR, sinus tachycardia on monitor,  no r/m/g Pulm: Regular, non labored on 4 L nasal cannula, breath sounds diminished throughout GI: Soft, distended, non tender, bs x  4 Skin: limited exam, no rashes/lesions noted Extremities: warm/dry, pulses + 2 R/P, no edema noted  Resolved Hospital Problem list   N/A  Assessment & Plan:  Sepsis with septic shock Due to suspected intra-abdominal infection vs aspiration pneumonia Lactic: > 11, CXR: Bilateral perihilar opacities concerning for pneumonia, CT: new moderate grade small bowel obstruction, stomach severely distended with air-fluid level, scattered airspace opacities at bilateral lung bases and right middle lobe with debris in the trachea concerning for aspiration pneumonia. Initial interventions/workup included: 2.5 L of NS & Cefepime/ Metronidazole At this point, patient is declining further therapies. After discussion he agrees to DO NOT RESUSCITATE status We will get palliative care involved, comfort measures/hospice best course of care at this point  Moderate grade small bowel obstruction -Patient declines NG, removed it on his own -General surgery consulted, appreciate input -Difficult to provide supportive care as patient is refusing therapies  Acute Kidney Injury  Multifocal in the setting of hypotension d/t septic shock, dehydration and severe anemia.  Anion gap metabolic acidosis PMHx: Kidney stones CT of the abdomen and pelvis also showed large partially obstructing stones in the right kidney. Baseline creatinine 1.09 (12/21), creatinine on admission 4.7 Continue IV fluids  Hypoalbuminemia due to malnutrition/poor p.o. intake Patient has new small bowel obstruction Patient wants to start p.o.'s Understands it may lead to aspiration/death Patient wants to eat nevertheless  Best practice (evaluated daily)  Diet: Liberalize p.o. intake Pain/Anxiety/Delirium protocol (if indicated): n/a VAP protocol (if indicated): n/a DVT prophylaxis: heparin SQ GI prophylaxis: protonix IV Glucose control: monitor Q 4, discontinue-patient refusing Mobility: bedrest, mobilize as tolerated Disposition:  Stepdown, if patient decides on comfort measures transfer to [facility].  Goals of Care:  Last date of multidisciplinary goals of care discussion: 07/22/2020 Family and staff present:  Summary of discussion:  Follow up goals of care discussion due: 07/22/2020, done Code Status: DNR  Labs   CBC: Recent Labs  Lab 08/12/2020 1659 07/22/20 0021  WBC 19.3*  --   NEUTROABS 17.4*  --   HGB 5.7* 8.2*  HCT 19.6* 26.8*  MCV 64.9*  --   PLT 1,033*  --     Basic Metabolic Panel: Recent Labs  Lab 07/28/2020 1659  NA 139  K 3.8  CL 92*  CO2 18*  GLUCOSE 106*  BUN 82*  CREATININE 4.70*  CALCIUM 8.3*   GFR: Estimated Creatinine Clearance: 10.4 mL/min (A) (by C-G formula based on SCr of 4.7 mg/dL (H)). Recent Labs  Lab 07/20/2020 1659 07/17/2020 2106 07/22/20 0022  WBC 19.3*  --   --   LATICACIDVEN >11.0* 4.7* 1.9    Liver Function Tests: Recent Labs  Lab 07/31/2020 1659  AST 42*  ALT 15  ALKPHOS 90  BILITOT 0.8  PROT 6.6  ALBUMIN 2.6*   No results for input(s): LIPASE, AMYLASE in the last 168 hours. No results for input(s): AMMONIA in the last 168 hours.  ABG No results found for: PHART, PCO2ART, PO2ART, HCO3, TCO2, ACIDBASEDEF, O2SAT   Coagulation Profile: Recent Labs  Lab 07/20/2020 1659  INR 1.2    Cardiac Enzymes: No results for input(s): CKTOTAL, CKMB, CKMBINDEX, TROPONINI in the last 168 hours.  HbA1C: No results found for: HGBA1C  CBG: Recent Labs  Lab 07/22/20 0037 07/22/20 0439 07/22/20 0714  GLUCAP 73 122* 109*    Review of Systems:  Limited due to the patient's debilitated state, only complaint of right pelvic pain.   Allergies Allergies  Allergen Reactions   Sulfa Antibiotics Hives     Current medications  Scheduled Meds: Continuous Infusions:  sodium chloride Stopped (08/06/2020 2118)   sodium chloride Stopped (08/02/2020 2117)   dextrose 5% lactated ringers 10 mL/hr at 07/22/20 1727   PRN Meds:.fentaNYL (SUBLIMAZE) injection,  ondansetron (ZOFRAN) IV   Critical care time: N/A Level 3 follow-up     Patient's overall prognosis is poor.  He continues to decline therapies.  Have contacted palliative care for consultation query transition to hospice care.   Renold Don, MD [facility]   *This note was dictated using voice recognition software/Dragon.  Despite best efforts to proofread, errors can occur which can change the meaning.  Any change was purely unintentional.

## 2020-07-22 NOTE — Progress Notes (Signed)
Discussed bedside with the patient the importance of replacing the NGT. The patient remains adamant that he does not want another NGT placed at this time.  Will continue to monitor patient closely.   Domingo Pulse Rust-Chester, AGACNP-BC Acute Care Nurse Practitioner [facility]   [phone] / [phone] Please see Amion for pager details.

## 2020-07-22 NOTE — Progress Notes (Signed)
Pharmacy Antibiotic Note  Wayne Mccarty is a 70 y.o. male admitted on 2020/07/28 with sepsis.  Pharmacy has been consulted for Cefepime dosing.  CrCl = 10.5 ml/min  Plan: Cefepime 2 gm IV X 1 given in ED on 2/6 @ 1841. Cefepime 2 gm IV Q24H ordered to continue on 2/7 @ 1900.   Weight: 51 kg (112 lb 7 oz)  Temp (24hrs), Avg:96.8 F (36 C), Min:94.5 F (34.7 C), Max:98.3 F (36.8 C)  Recent Labs  Lab 28-Jul-2020 1659 2020-07-28 2106 07/22/20 0022  WBC 19.3*  --   --   CREATININE 4.70*  --   --   LATICACIDVEN >11.0* 4.7* 1.9    Estimated Creatinine Clearance: 10.4 mL/min (A) (by C-G formula based on SCr of 4.7 mg/dL (H)).    Allergies  Allergen Reactions  . Sulfa Antibiotics Hives    Antimicrobials this admission:   >>    >>   Dose adjustments this admission:   Microbiology results:  BCx:   UCx:    Sputum:    MRSA PCR:    Thank you for allowing pharmacy to be a part of this patient's care.  Sequoia Mincey D 07/22/2020 1:59 AM

## 2020-07-22 NOTE — ED Notes (Signed)
Secure chat Dr. Vernard Gambles regarding the initial EDP order of 2 units of blood. Per chart and blood bank confirmation, only 1 unit of blood hung and completed. Orders to hold second unit of blood at this time.

## 2020-07-22 NOTE — ED Notes (Signed)
Lab states they should be down in 5-10 minutes to help with blood draw

## 2020-07-22 NOTE — ED Notes (Addendum)
Phlebotomy down to bedside, pt refusing all morning labs. Multiple attempts made to education pt on importance of labs, pt continues to refuse. Provider Rust-Chester notified

## 2020-07-22 NOTE — ED Notes (Signed)
Per ICU provider Toribio Harbour, pt does not need 2nd unit of blood at this time d/t repeat H&H results.

## 2020-07-22 NOTE — ED Notes (Signed)
Was advised in report that pt is declining blood draws , was advised that MD was aware .

## 2020-07-22 NOTE — Progress Notes (Signed)
Code Sepsis Completion note:  Secure chat sent to ED RN around 0016 about a repeat LA after the 4.7 and down to 1.9. Will be admitted to the ICU for further monitoring.   Latitia Housewright [facility] [employer] RN

## 2020-07-23 ENCOUNTER — Ambulatory Visit: Payer: Medicare Other | Admitting: Family Medicine

## 2020-07-23 DIAGNOSIS — R1900 Intra-abdominal and pelvic swelling, mass and lump, unspecified site: Secondary | ICD-10-CM

## 2020-07-23 DIAGNOSIS — E43 Unspecified severe protein-calorie malnutrition: Secondary | ICD-10-CM

## 2020-07-23 DIAGNOSIS — K56609 Unspecified intestinal obstruction, unspecified as to partial versus complete obstruction: Secondary | ICD-10-CM

## 2020-07-23 DIAGNOSIS — D649 Anemia, unspecified: Secondary | ICD-10-CM

## 2020-07-23 DIAGNOSIS — J690 Pneumonitis due to inhalation of food and vomit: Secondary | ICD-10-CM

## 2020-07-24 LAB — URINE CULTURE: Culture: 60000 — AB

## 2020-07-26 LAB — CULTURE, BLOOD (SINGLE): Culture: NO GROWTH

## 2020-08-13 NOTE — Progress Notes (Signed)
Pt declined throughout the night. Primary nurse spoke to Dr. Damita Dunnings last night and received orders for morphine for repository distress. Pt given Morphine twice with relief . Pt less alert as the night progressed. Report given to oncoming nurse with all questions answered.

## 2020-08-13 NOTE — Progress Notes (Signed)
Brief Progress Note Patient made comfort care yesterday afternoon. Pursuing placement in hospice house once room available.   General surgery will sign off. Please call with any questions or concerns.   -- Edison Simon, PA-C [facility] 08-13-2020, 7:03 AM [phone] M-F: 7am - 4pm

## 2020-08-13 NOTE — Death Summary Note (Signed)
DEATH SUMMARY   Patient Details  Name: Wayne Mccarty MRN: [id number] DOB: February 08, 1951  Admission/Discharge Information   Admit Date:  2020-08-13  Date of Death: Date of Death: 2020/08/15  Time of Death: Time of Death: 0800  Length of Stay: 2  Referring Physician: Lorrene Reid, PA-C   Reason(s) for Hospitalization  Progressive fatigue and abdominal pain Small bowel obstruction  Diagnoses  Preliminary cause of death:  Secondary Diagnoses (including complications and co-morbidities):  Principal Problem:   Sepsis ([place]) Active Problems:   SBO (small bowel obstruction) ([place])   Aspiration pneumonia ([place])   Pelvic mass   Severe malnutrition ([place])   Anemia  Sepsis qualified as: severe sepsis with septic shock, present on admission.   Brief Hospital Course (including significant findings, care, treatment, and services provided and events leading to death)  Wayne Mccarty is a 70 y.o. year old male who presented to the ED from home via EMS at the insistence of his sister with progressive fatigue and abdominal pain.  The patient was seen at [facility] ED on 06/14/2020 and found to likely have a colon mass with severe anemia.  The patient left AMA refusing admission and diagnostic work-up.  ED/hospital course: Initial vitals-T 94.5(rectal), RR 27, HR 92, BP 79/43, SPO2 90% on 3 L nasal cannula. Labs significant for-lactic > 11, hemoglobin 5.7, platelets 1033, WBC 19.3, serum CO2 18, BUN/creatinine 82/4.7 (increased from 22/1.09 in December 2021), AST 42, AG 29. Sepsis protocol initiated & blood transfusion ordered.  CT of chest/abdomen/pelvis concerning for aspiration pneumonia and moderate grade small bowel obstruction as well as a persistent mass in the patient's sigmoid colon.  Dr. Corky Downs spoke with Dr. Christian Mate in regards to the new small bowel obstruction, general surgery wanted the patient further resuscitated before intervention agreeing to NG tube placement with decompression.   PCCM consulted for admission due to vasopressor requirement.  The patient had NG tube placed however he remove this and was refusing therapies with the exception of pain management.  He did receive 1 unit of blood due to very severe anemia.  A second unit of blood was ordered however he never received this due to refusal.  Anemia was likely due to chronic blood loss from a likely colon cancer (pelvic mass) not diagnosed.  Discussions with the patient at this point revealed that he wanted to be DNR.  Palliative care consultation was obtained for plan to transition to hospice.  The patient was transitioned to comfort care.  Per his wishes and the wishes of his sister.  He was admitted to the medical surgical floor with comfort measures.  He expired quietly at 0800 hrs. on 08-15-20.    Pertinent Labs and Studies  Significant Diagnostic Studies DG Chest Port 1 View  Result Date: August 13, 2020 CLINICAL DATA:  Questionable sepsis, evaluate for abnormality EXAM: PORTABLE CHEST 1 VIEW COMPARISON:  None. FINDINGS: The heart size and mediastinal contours are within normal limits. Right perihilar airspace opacity with left perihilar streaky opacities. The visualized skeletal structures are unremarkable. IMPRESSION: Right perihilar airspace opacity with left perihilar streaky opacities, findings concerning for multifocal pneumonia. Electronically Signed   By: Dahlia Bailiff MD   On: 08-13-2020 17:25   DG Abd Portable 1V  Result Date: 08/13/2020 CLINICAL DATA:  Nasogastric tube placement. EXAM: PORTABLE ABDOMEN - 1 VIEW COMPARISON:  Same day CT chest abdomen pelvis. FINDINGS: Nasogastric tube with tip and side port overlying the stomach. Persistent small bowel obstruction. Bibasilar airspace opacities. IMPRESSION: Nasogastric tube tip  and side port overlying the stomach. Electronically Signed   By: Dahlia Bailiff MD   On: 07/20/2020 21:10   CT CHEST ABDOMEN PELVIS WO CONTRAST  Result Date: 07/22/2020 CLINICAL DATA:   Abdominal distension.  History of failure to thrive. EXAM: CT CHEST, ABDOMEN AND PELVIS WITHOUT CONTRAST TECHNIQUE: Multidetector CT imaging of the chest, abdomen and pelvis was performed following the standard protocol without IV contrast. COMPARISON:  June 15, 2020 FINDINGS: CT CHEST FINDINGS Cardiovascular: The heart size is unremarkable. The intracardiac blood pool is hypodense relative to the adjacent myocardium consistent with anemia. There are coronary artery calcifications. There are mild atherosclerotic changes of the thoracic aorta. The intracardiac blood pool is hypodense relative to the adjacent myocardium consistent with anemia. Mediastinum/Nodes: -- No mediastinal lymphadenopathy. -- No hilar lymphadenopathy. -- No axillary lymphadenopathy. -- No supraclavicular lymphadenopathy. -- Normal thyroid gland where visualized. -there is mild diffuse wall thickening of the esophagus. The esophagus is fluid-filled to the level of the upper thorax. Lungs/Pleura: There is scattered airspace opacities at the lung bases bilaterally and the right middle lobe. There is debris within the trachea. Findings are concerning for aspiration pneumonia. There is no pneumothorax. No significant pleural effusion. Musculoskeletal: No chest wall abnormality. No bony spinal canal stenosis. CT ABDOMEN PELVIS FINDINGS Hepatobiliary: The liver is normal. Normal gallbladder.There is no biliary ductal dilation. Pancreas: Normal contours without ductal dilatation. No peripancreatic fluid collection. Spleen: Unremarkable. Adrenals/Urinary Tract: --Adrenal glands: Unremarkable. 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 There are no definite distal ureteral stones. --Left kidney/ureter: No hydronephrosis or radiopaque kidney stones. --Urinary bladder: The bladder is not well evaluated secondary  to underdistention and lack of IV contrast. The patient's known pelvic mass abuts the bladder wall. There is loss of a fat plane between the bladder wall and this mass. Stomach/Bowel: --Stomach/Duodenum: The stomach is severely distended with an air-fluid level. 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 There is a new moderate grade small bowel obstruction with a transition point in the patient's high midline pelvis. This transition point is adjacent to the known pelvic mass and may be secondary to direct invasion of the small bowel. --Colon: Again noted is a large mass centered at the level of the patient's sigmoid colon. This mass appears no chronic but overall appears to have decreased in size since the prior study. There is a large amount of stool throughout the colon. --Appendix: Normal. Vascular/Lymphatic: Atherosclerotic calcification is present within the non-aneurysmal abdominal aorta, without hemodynamically significant stenosis. --No retroperitoneal lymphadenopathy. --No mesenteric lymphadenopathy. --No pelvic or inguinal lymphadenopathy. Reproductive: Unremarkable Other: No ascites or free air. The abdominal wall is normal. Musculoskeletal. No acute displaced fractures. IMPRESSION: 1. New moderate grade small bowel obstruction with a transition point in the patient's high midline pelvis. This transition point is adjacent to the known pelvic mass and may be secondary to direct invasion of the small bowel. 2. The stomach is severely distended with an air-fluid level. The patient would likely benefit from NG tube decompression. 3. Scattered airspace opacities at the lung bases bilaterally and the right middle lobe. There is debris within the trachea. Findings are concerning for aspiration pneumonia. 4. 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 5. Persistent mass involving the  patient's sigmoid colon which appears to have decreased  in size from the prior study. This mass appears to directly abut the urinary bladder and multiple loops of small bowel. 6. Anemia. Aortic Atherosclerosis (ICD10-I70.0). Electronically Signed   By: Constance Holster M.D.   On: 08/11/2020 19:14    Microbiology Recent Results (from the past 240 hour(s))  Blood culture (routine single)     Status: None (Preliminary result)   Collection Time: 08/04/2020  4:59 PM   Specimen: BLOOD  Result Value Ref Range Status   Specimen Description BLOOD LEFT BICEP  Final   Special Requests   Final    BOTTLES DRAWN AEROBIC AND ANAEROBIC Blood Culture results may not be optimal due to an inadequate volume of blood received in culture bottles   Culture   Final    NO GROWTH 2 DAYS Performed at [facility], [address], [place], [place] [address]    Report Status PENDING  Incomplete  SARS Coronavirus 2 by RT PCR (hospital order, performed in [place] hospital lab) Nasopharyngeal Nasopharyngeal Swab     Status: None   Collection Time: 07/31/2020  5:10 PM   Specimen: Nasopharyngeal Swab  Result Value Ref Range Status   SARS Coronavirus 2 NEGATIVE NEGATIVE Final    Comment: (NOTE) SARS-CoV-2 target nucleic acids are NOT DETECTED.  The SARS-CoV-2 RNA is generally detectable in upper and lower respiratory specimens during the acute phase of infection. The lowest concentration of SARS-CoV-2 viral copies this assay can detect is 250 copies / mL. A negative result does not preclude SARS-CoV-2 infection and should not be used as the sole basis for treatment or other patient management decisions.  A negative result may occur with improper specimen collection / handling, submission of specimen other than nasopharyngeal swab, presence of viral mutation(s) within the areas targeted by this assay, and inadequate number of viral copies (<250 copies / mL). A negative result must be combined with  clinical observations, patient history, and epidemiological information.  Fact Sheet for Patients:   StrictlyIdeas.no  Fact Sheet for Healthcare Providers: BankingDealers.co.za  This test is not yet approved or  cleared by the [place] FDA and has been authorized for detection and/or diagnosis of SARS-CoV-2 by FDA under an Emergency Use Authorization (EUA).  This EUA will remain in effect (meaning this test can be used) for the duration of the COVID-19 declaration under Section 564(b)(1) of the Act, 21 U.S.C. section 360bbb-3(b)(1), unless the authorization is terminated or revoked sooner.  Performed at [facility], [address] [place], [place] [address]   MRSA PCR Screening     Status: None   Collection Time: 07/22/20  5:32 AM   Specimen: Nasal Mucosa; Nasopharyngeal  Result Value Ref Range Status   MRSA by PCR NEGATIVE NEGATIVE Final    Comment:        The GeneXpert MRSA Assay (FDA approved for NASAL specimens only), is one component of a comprehensive MRSA colonization surveillance program. It is not intended to diagnose MRSA infection nor to guide or monitor treatment for MRSA infections. Performed at [facility], [address] [place], [place] [address]   Urine culture     Status: None (Preliminary result)   Collection Time: 07/22/20  7:15 AM   Specimen: Urine, Random  Result Value Ref Range Status   Specimen Description   Final    URINE, RANDOM Performed at [facility], [address], [place], [place] [address]    Special Requests   Final    NONE Performed  at [facility], [address] [place], [place] [address]    Culture   Final    CULTURE REINCUBATED FOR BETTER GROWTH Performed at [facility], [place] [address] [place], [place] [address]    Report Status PENDING  Incomplete    Lab Basic Metabolic Panel: Recent Labs  Lab 07/17/2020 1659  NA  139  K 3.8  CL 92*  CO2 18*  GLUCOSE 106*  BUN 82*  CREATININE 4.70*  CALCIUM 8.3*   Liver Function Tests: Recent Labs  Lab 08/01/2020 1659  AST 42*  ALT 15  ALKPHOS 90  BILITOT 0.8  PROT 6.6  ALBUMIN 2.6*   No results for input(s): LIPASE, AMYLASE in the last 168 hours. No results for input(s): AMMONIA in the last 168 hours. CBC: Recent Labs  Lab 07/16/2020 1659 07/22/20 0021  WBC 19.3*  --   NEUTROABS 17.4*  --   HGB 5.7* 8.2*  HCT 19.6* 26.8*  MCV 64.9*  --   PLT 1,033*  --    Cardiac Enzymes: No results for input(s): CKTOTAL, CKMB, CKMBINDEX, TROPONINI in the last 168 hours. Sepsis Labs: Recent Labs  Lab 08/05/2020 1659 08/05/2020 2106 07/22/20 0022  WBC 19.3*  --   --   LATICACIDVEN >11.0* 4.7* 1.9    Procedures/Operations  Patient refused procedures.   Renold Don, MD  PCCM August 12, 2020, 8:29 AM   *This note was dictated using voice recognition software/Dragon.  Despite best efforts to proofread, errors can occur which can change the meaning.  Any change was purely unintentional.

## 2020-08-13 NOTE — Clinical Social Work Note (Signed)
CSW acknowledges consult that patient is on comfort care and will need hospice. Per documentation, palliative NP made referral to [facility] yesterday. No bed yet. Sent secure chat to Zandra Abts, RN with [facility] to let her know this CSW is following patient.  Dayton Scrape, [place]

## 2020-08-13 NOTE — Progress Notes (Signed)
Pt expired at 0800. Sister Keturah Shavers, MD Duwayne Heck, and charge nurse made aware. Documentation is complete. [employer] notified.

## 2020-08-13 DEATH — deceased

## 2021-06-03 IMAGING — DX DG CHEST 1V PORT
1 series · 1 of 1 positions shown · non-contrast
Comparison: None.

CLINICAL DATA: Questionable sepsis, evaluate for abnormality

EXAM:
PORTABLE CHEST 1 VIEW

[chest ap]
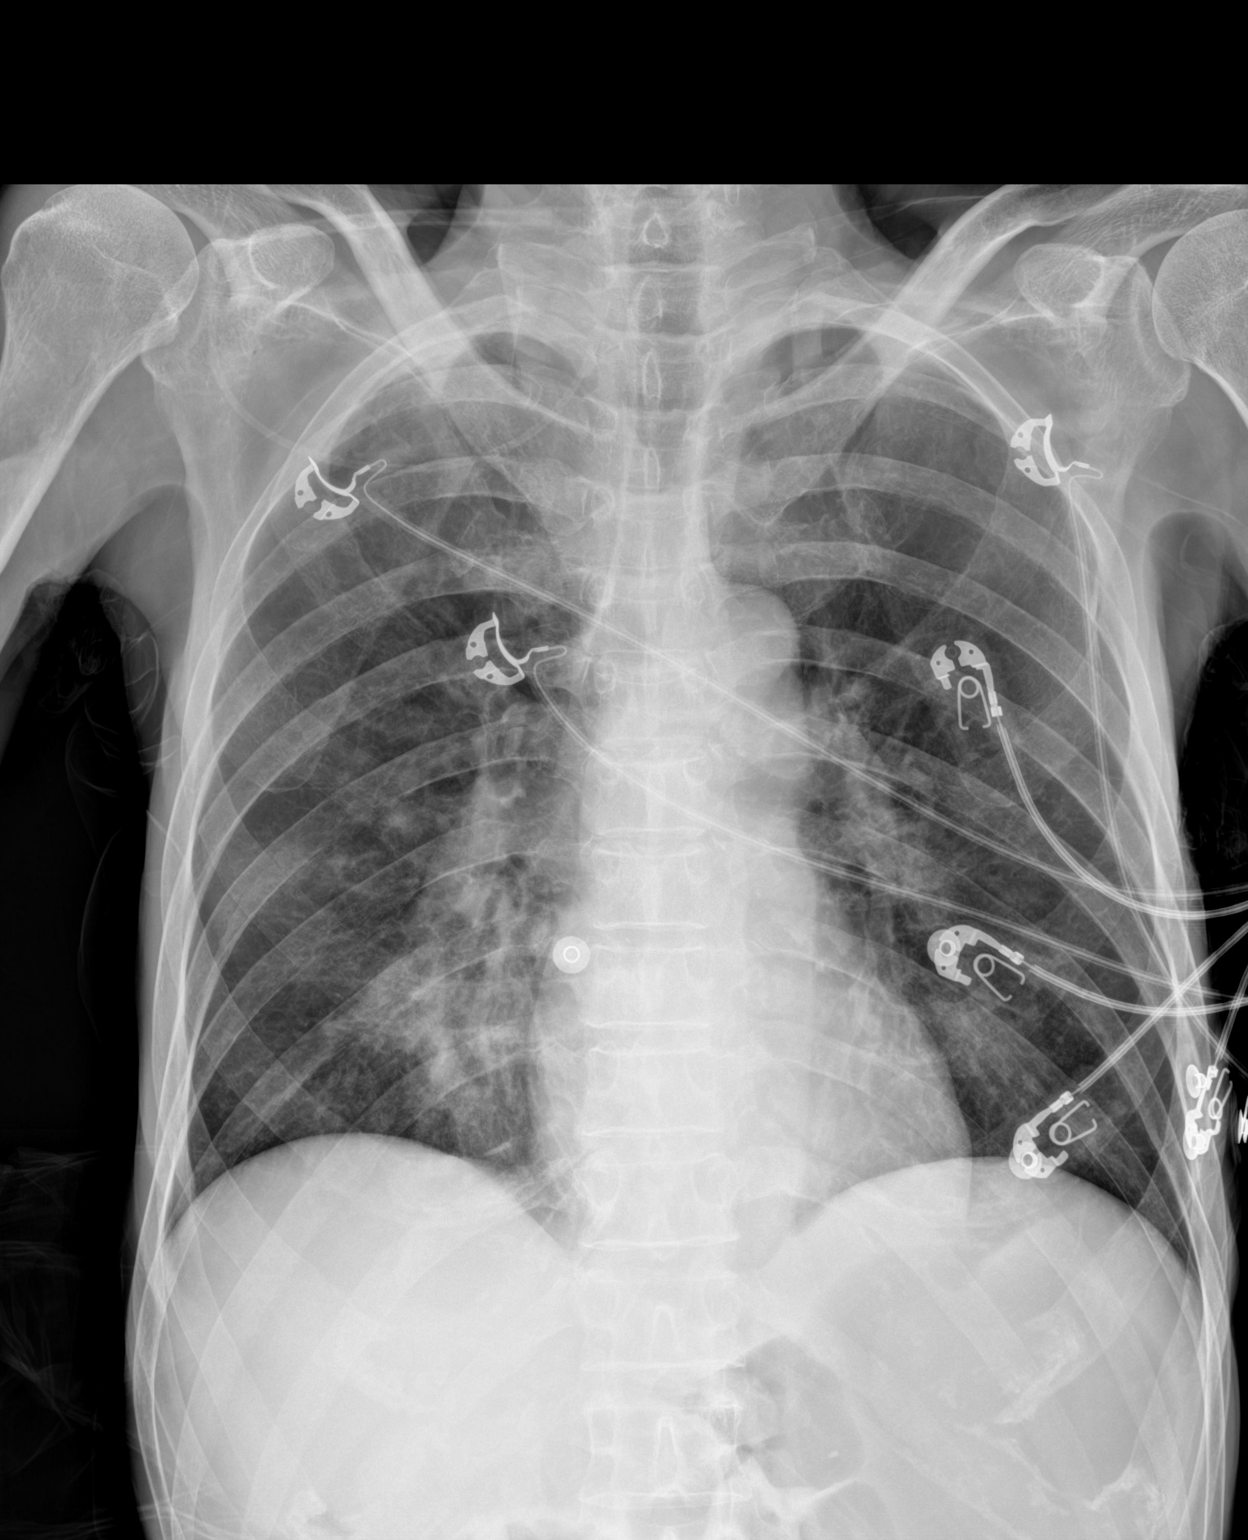

[1 of 1 positions shown; findings below may reference images not displayed]

FINDINGS: The heart size and mediastinal contours are within normal limits.
Right perihilar airspace opacity with left perihilar streaky
opacities. The visualized skeletal structures are unremarkable.
IMPRESSION: Right perihilar airspace opacity with left perihilar streaky
opacities, findings concerning for multifocal pneumonia.

## 2021-06-03 IMAGING — CT CT CHEST-ABD-PELV W/O CM
2 of 4 series · 11 of 36 positions shown, 12 images · non-contrast
Comparison: June 15, 2020

CLINICAL DATA: Abdominal distension.  History of failure to thrive.

EXAM:
CT CHEST, ABDOMEN AND PELVIS WITHOUT CONTRAST
TECHNIQUE: Multidetector CT imaging of the chest, abdomen and pelvis was
performed following the standard protocol without IV contrast.

[Series 2: cap wo st · axial · 0.66mm/px · z∈[-1174,-624]mm · 8 of 132 slices shown, 9 images]
[im 11/132  mediastinal]
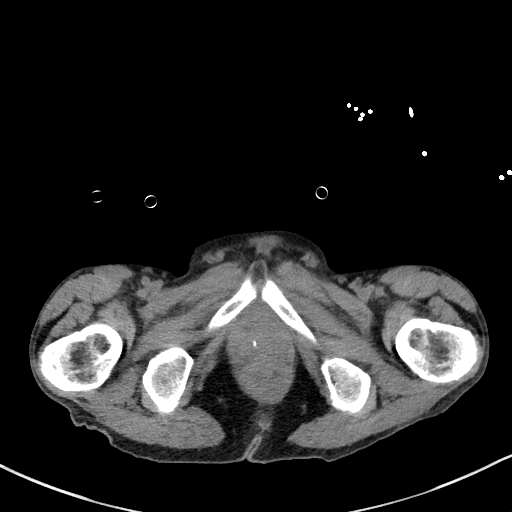
[im 11/132  bone]
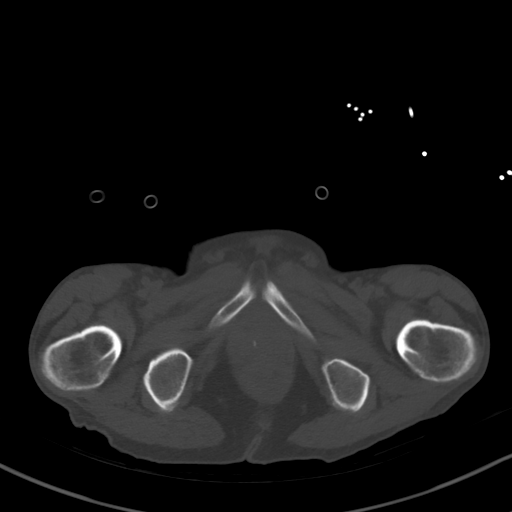
[im 31/132  mediastinal]
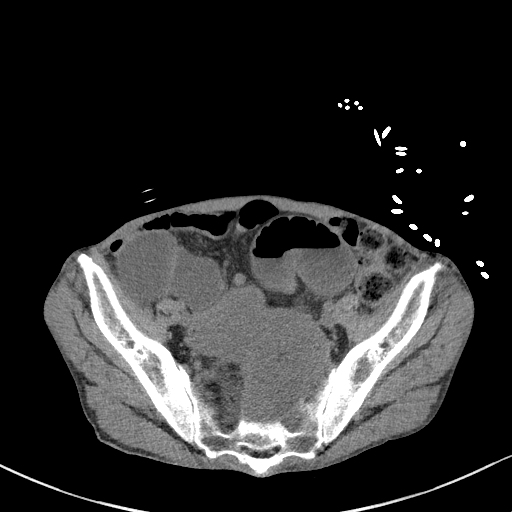
[im 41/132  mediastinal]
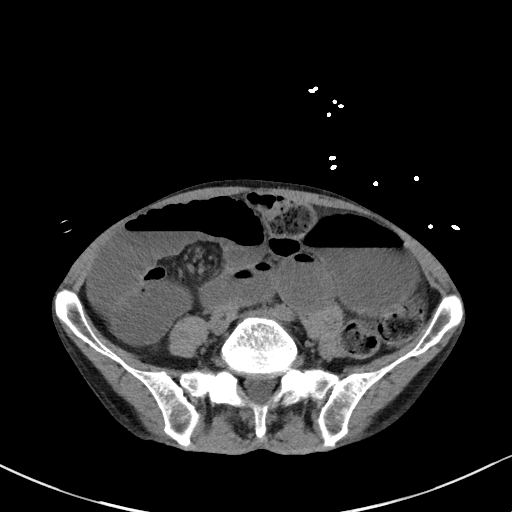
[im 61/132  mediastinal]
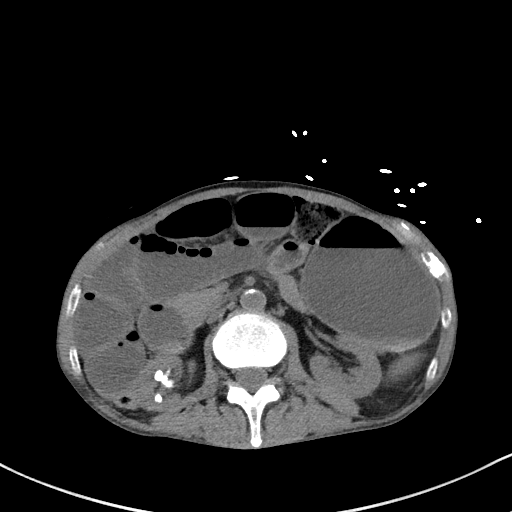
[im 71/132  mediastinal]
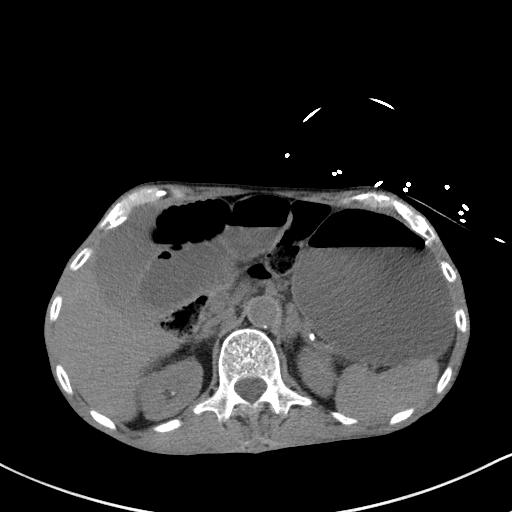
[im 91/132  mediastinal]
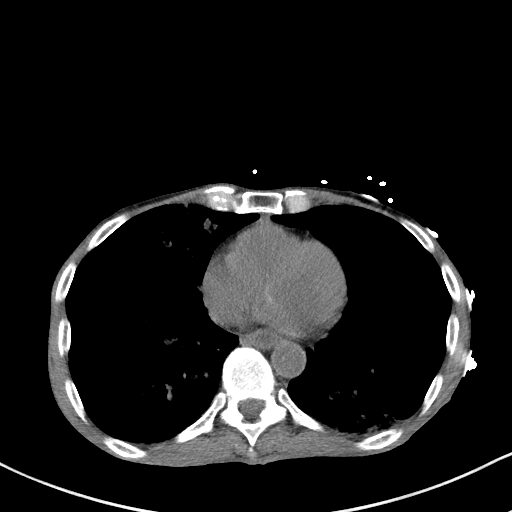
[im 101/132  mediastinal]
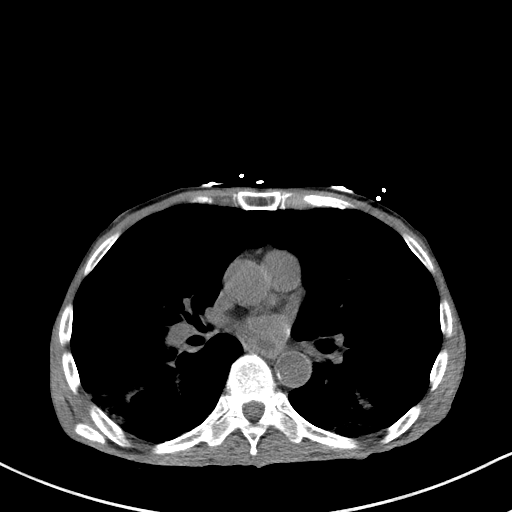
[im 121/132  mediastinal]
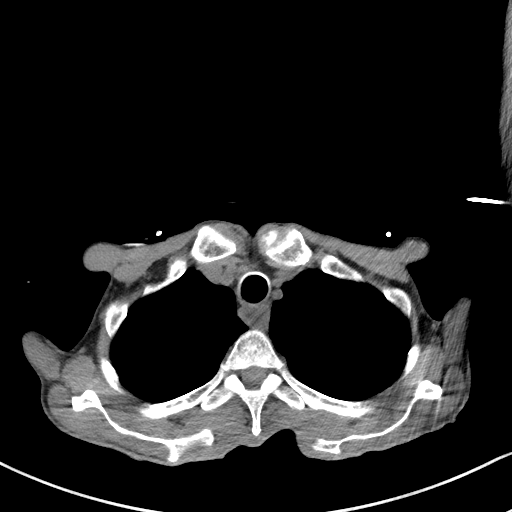

[Series 5: coronal · coronal · 0.59mm/px · 3 of 104 slices shown]
[im 21/104  mediastinal]
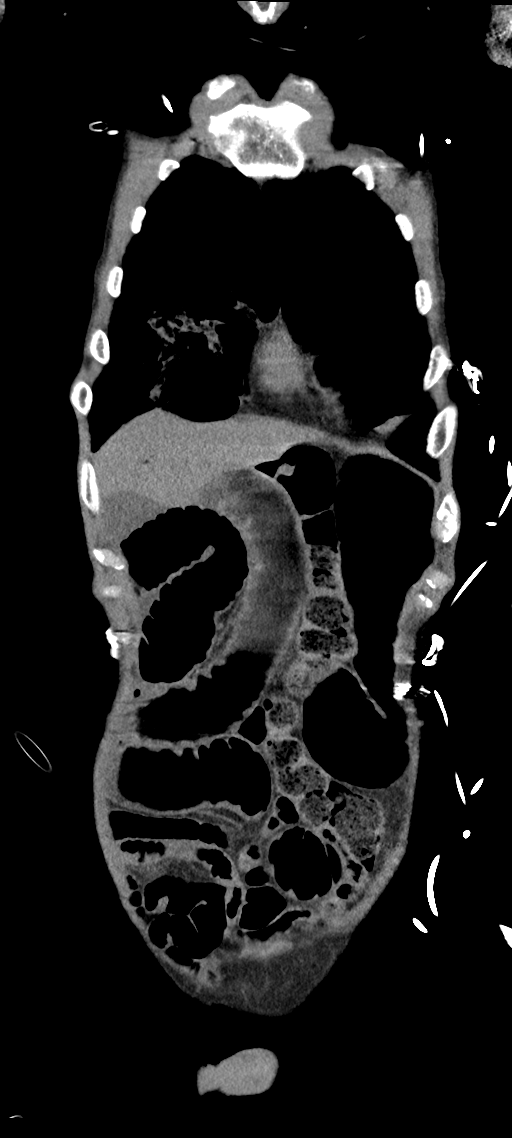
[im 42/104  mediastinal]
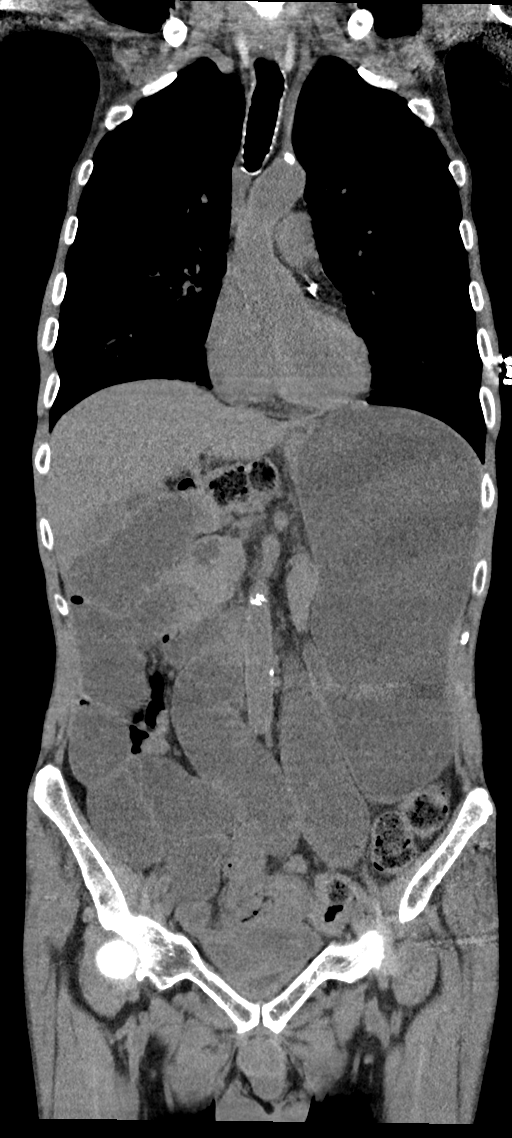
[im 62/104  mediastinal]
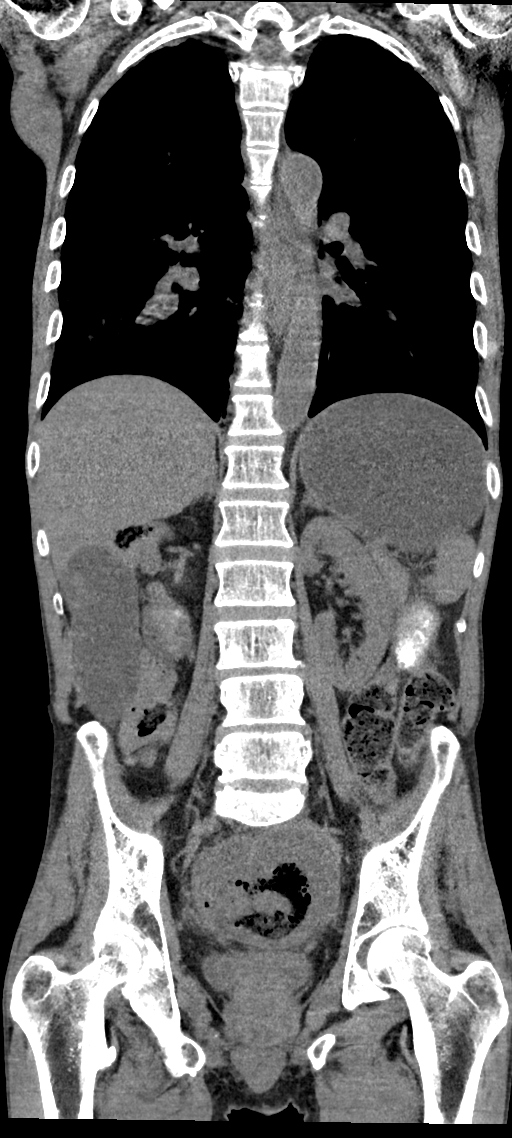

[11 of 36 positions shown; findings below may reference images not displayed]

FINDINGS: CT CHEST FINDINGS

Cardiovascular: The heart size is unremarkable. The intracardiac
blood pool is hypodense relative to the adjacent myocardium
consistent with anemia. There are coronary artery calcifications.
There are mild atherosclerotic changes of the thoracic aorta. The
intracardiac blood pool is hypodense relative to the adjacent
myocardium consistent with anemia.

Mediastinum/Nodes:

-- No mediastinal lymphadenopathy.

-- No hilar lymphadenopathy.

-- No axillary lymphadenopathy.

-- No supraclavicular lymphadenopathy.

-- Normal thyroid gland where visualized.

-there is mild diffuse wall thickening of the esophagus. The
esophagus is fluid-filled to the level of the upper thorax.

Lungs/Pleura: There is scattered airspace opacities at the lung
bases bilaterally and the right middle lobe. There is debris within
the trachea. Findings are concerning for aspiration pneumonia. There
is no pneumothorax. No significant pleural effusion.

Musculoskeletal: No chest wall abnormality. No bony spinal canal
stenosis.

CT ABDOMEN PELVIS FINDINGS

Hepatobiliary: The liver is normal. Normal gallbladder.There is no
biliary ductal dilation.

Pancreas: Normal contours without ductal dilatation. No
peripancreatic fluid collection.

Spleen: Unremarkable.

Adrenals/Urinary Tract:

--Adrenal glands: Unremarkable.

--Right kidney/ureter: There are multiple large partially
obstructing stones in the right kidney including a staghorn calculus
measuring approximately 2.8 cm located in the renal pelvis. There
are no definite distal ureteral stones.

--Left kidney/ureter: No hydronephrosis or radiopaque kidney stones.

--Urinary bladder: The bladder is not well evaluated secondary to
underdistention and lack of IV contrast. The patient's known pelvic
mass abuts the bladder wall. There is loss of a fat plane between
the bladder wall and this mass.

Stomach/Bowel:

--Stomach/Duodenum: The stomach is severely distended with an
air-fluid level.

--Small bowel: There is evidence for a small bowel malrotation.
There is a new moderate grade small bowel obstruction with a
transition point in the patient's high midline pelvis. This
transition point is adjacent to the known pelvic mass and may be
secondary to direct invasion of the small bowel.

--Colon: Again noted is a large mass centered at the level of the
patient's sigmoid colon. This mass appears no chronic but overall
appears to have decreased in size since the prior study. There is a
large amount of stool throughout the colon.

--Appendix: Normal.

Vascular/Lymphatic: Atherosclerotic calcification is present within
the non-aneurysmal abdominal aorta, without hemodynamically
significant stenosis.

--No retroperitoneal lymphadenopathy.

--No mesenteric lymphadenopathy.

--No pelvic or inguinal lymphadenopathy.

Reproductive: Unremarkable

Other: No ascites or free air. The abdominal wall is normal.

Musculoskeletal. No acute displaced fractures.
IMPRESSION: 1. New moderate grade small bowel obstruction with a transition
point in the patient's high midline pelvis. This transition point is
adjacent to the known pelvic mass and may be secondary to direct
invasion of the small bowel.
2. The stomach is severely distended with an air-fluid level. The
patient would likely benefit from NG tube decompression.
3. Scattered airspace opacities at the lung bases bilaterally and
the right middle lobe. There is debris within the trachea. Findings
are concerning for aspiration pneumonia.
4. Large partially obstructing stones in the right kidney including
a staghorn calculus measuring approximately 2.8 cm located in the
renal pelvis.
5. Persistent mass involving the patient's sigmoid colon which
appears to have decreased in size from the prior study. This mass
appears to directly abut the urinary bladder and multiple loops of
small bowel.
6. Anemia.

Aortic Atherosclerosis ([id number]).
